# Patient Record
Sex: Female | Born: 1985
Health system: Southern US, Community
[De-identification: ages and names within clinical notes are randomized; demographics above are authoritative.]

## PROBLEM LIST (undated history)

## (undated) ENCOUNTER — Emergency Department (HOSPITAL_COMMUNITY): Discharge status: Absent without leave | Payer: 59

## (undated) DIAGNOSIS — F419 Anxiety disorder, unspecified: Secondary | ICD-10-CM

## (undated) DIAGNOSIS — E079 Disorder of thyroid, unspecified: Secondary | ICD-10-CM

## (undated) HISTORY — DX: Anxiety disorder, unspecified: F41.9

## (undated) HISTORY — DX: Disorder of thyroid, unspecified: E07.9

## (undated) HISTORY — PX: OTHER SURGICAL HISTORY: SHX169

---

## 2009-08-03 DIAGNOSIS — E0500 Thyrotoxicosis with diffuse goiter without thyrotoxic crisis or storm: Secondary | ICD-10-CM | POA: Insufficient documentation

## 2013-07-12 DIAGNOSIS — E890 Postprocedural hypothyroidism: Secondary | ICD-10-CM | POA: Insufficient documentation

## 2016-10-14 DIAGNOSIS — E890 Postprocedural hypothyroidism: Secondary | ICD-10-CM | POA: Diagnosis not present

## 2016-12-06 DIAGNOSIS — J1089 Influenza due to other identified influenza virus with other manifestations: Secondary | ICD-10-CM | POA: Diagnosis not present

## 2016-12-24 DIAGNOSIS — E890 Postprocedural hypothyroidism: Secondary | ICD-10-CM | POA: Diagnosis not present

## 2017-02-10 MED FILL — LEVOTHYROXINE 175 MCG TAB: 175 | 90 days supply | Qty: 102 | Fill #0

## 2017-02-26 ENCOUNTER — Ambulatory Visit (INDEPENDENT_AMBULATORY_CARE_PROVIDER_SITE_OTHER): Payer: 59 | Admitting: Nurse Practitioner

## 2017-02-26 ENCOUNTER — Encounter: Payer: Self-pay | Admitting: Nurse Practitioner

## 2017-02-26 VITALS — BP 114/76 | HR 84 | Resp 16 | Ht 67.25 in | Wt 212.0 lb

## 2017-02-26 DIAGNOSIS — Z01419 Encounter for gynecological examination (general) (routine) without abnormal findings: Secondary | ICD-10-CM | POA: Diagnosis not present

## 2017-02-26 DIAGNOSIS — Z124 Encounter for screening for malignant neoplasm of cervix: Secondary | ICD-10-CM | POA: Diagnosis not present

## 2017-02-26 DIAGNOSIS — Z0000 Encounter for general adult medical examination without abnormal findings: Secondary | ICD-10-CM | POA: Diagnosis not present

## 2017-02-26 DIAGNOSIS — Z113 Encounter for screening for infections with a predominantly sexual mode of transmission: Secondary | ICD-10-CM | POA: Diagnosis not present

## 2017-02-26 NOTE — Progress Notes (Signed)
31 y.o. G0P0A0. African American Fe here for NGYN annual exam. Menses normal every 3 months with Seasonique.  This last cycle 2.5 weeks long.  She has taken pill late or missed pill during that time. Also off Synthroid  2 weeks due to transfer of pharmacy choice. Working 7 pm - 7 am 3 days a week.  Normally PMS, cramps,  Flow moderate  And at 3-4 days.    Same partner of 2.5 months  History of hypothyroid X 10 yrs.and followed by Dr. Tod Persia at [facility].  Would like to discuss other birth control options  Patient's last menstrual period was 02/23/2017.          Sexually active: Yes.    The current method of family planning is OCP (estrogen/progesterone).    Exercising: Yes.    running and weight lifting Smoker:  no  Health Maintenance: Pap:  January 2017 per patient normal -- done @ [facility]:  About 3-4 years ago per patient Hep C and HIV: about 2017 per patient -- NEG Labs: discuss today   reports that she has never smoked. She has never used smokeless tobacco. She reports that she drinks about 1.8 oz of alcohol per week . She reports that she does not use drugs.  Past Medical History:  Diagnosis Date  . Thyroid disease     History reviewed. No pertinent surgical history.  Current Outpatient Prescriptions  Medication Sig Dispense Refill  . Levonorgestrel-Ethinyl Estradiol (AMETHIA,CAMRESE) 0.15-0.03 &0.01 MG tablet Take 1 tablet by mouth daily.    Marland Kitchen levothyroxine (SYNTHROID, LEVOTHROID) 175 MCG tablet Take one tablet daily Monday-Saturday and 2 tablets on Sunday  0   No current facility-administered medications for this visit.     Family History  Problem Relation Age of Onset  . Thyroid disease Mother   . Thyroid disease Sister   . Down syndrome Sister   . Diabetes Paternal Aunt   . Diabetes Paternal Uncle   . Lung cancer Maternal Grandfather   . Prostate cancer Paternal Grandfather     ROS:  Pertinent items are noted in HPI.  Otherwise, a  comprehensive ROS was negative.  Exam:   BP 114/76 (BP Location: Right Arm, Patient Position: Sitting, Cuff Size: Large)   Pulse 84   Resp 16   Ht 5' 7.25" (1.708 m)   Wt 212 lb (96.2 kg)   LMP 02/23/2017   BMI 32.96 kg/m  Height: 5' 7.25" (170.8 cm) Ht Readings from Last 3 Encounters:  02/26/17 5' 7.25" (1.708 m)    General appearance: alert, cooperative and appears stated age Head: Normocephalic, without obvious abnormality, atraumatic Neck: no adenopathy, supple, symmetrical, trachea midline and thyroid normal to inspection and palpation Lungs: clear to auscultation bilaterally Breasts: normal appearance, no masses or tenderness Heart: regular rate and rhythm Abdomen: soft, non-tender; no masses,  no organomegaly Extremities: extremities normal, atraumatic, no cyanosis or edema Skin: Skin color, texture, turgor normal. No rashes or lesions Lymph nodes: Cervical, supraclavicular, and axillary nodes normal. No abnormal inguinal nodes palpated Neurologic: Grossly normal   Pelvic: External genitalia:  no lesions              Urethra:  normal appearing urethra with no masses, tenderness or lesions              Bartholin's and Skene's: normal                 Vagina: normal appearing vagina with normal color and discharge, no  lesions              Cervix: anteverted              Pap taken: Yes.   Bimanual Exam:  Uterus:  normal size, contour, position, consistency, mobility, non-tender              Adnexa: no mass, fullness, tenderness               Rectovaginal: Confirms               Anus:  normal sphincter tone, no lesions  Chaperone present: yes  A:  Well Woman with normal exam  OCP for contraception - desires other [place] IUD  R/O STD's  History of normal pap  P:   Reviewed health and wellness pertinent to exam  Pap smear was done  Follow with labs  Information is given about Verdia Kuba IUD and insurance info  Counseled on breast self exam, STD  prevention, family planning choices, adequate intake of calcium and vitamin D, diet and exercise return annually or prn  An After Visit Summary was printed and given to the patient.

## 2017-02-26 NOTE — Patient Instructions (Signed)

## 2017-02-27 LAB — STD PANEL
HEP B S AG: NEGATIVE
HIV: NONREACTIVE

## 2017-02-27 LAB — GC/CHLAMYDIA PROBE AMP
CT PROBE, AMP APTIMA: NOT DETECTED
GC Probe RNA: NOT DETECTED

## 2017-02-27 LAB — HEPATITIS C ANTIBODY: HCV AB: NEGATIVE

## 2017-02-27 LAB — WET PREP BY MOLECULAR PROBE
CANDIDA SPECIES: DETECTED — AB
GARDNERELLA VAGINALIS: DETECTED — AB
TRICHOMONAS VAG: NOT DETECTED

## 2017-02-27 LAB — IPS PAP TEST WITH REFLEX TO HPV

## 2017-02-27 MED ORDER — METRONIDAZOLE 0.75 % VA GEL: 1.0000 | Freq: Two times a day (BID) | VAGINAL | 0 refills | Status: AC

## 2017-02-27 MED ORDER — FLUCONAZOLE 150 MG PO TABS: ORAL_TABLET | ORAL | 0 refills | Status: DC

## 2017-02-27 MED FILL — VANDAZOLE VAGINAL 0.75% GEL: 0.75 | 5 days supply | Qty: 70 | Fill #0

## 2017-02-27 MED FILL — FLUCONAZOLE 150 MG TABLET: 150 | 5 days supply | Qty: 2 | Fill #0

## 2017-03-01 NOTE — Progress Notes (Signed)
Reviewed personally.  M. Suzanne Adekunle Rohrbach, MD.  

## 2017-05-28 MED FILL — LEVOTHYROXINE 175 MCG TAB: 175 | 89 days supply | Qty: 102 | Fill #0

## 2017-06-09 ENCOUNTER — Telehealth: Payer: Self-pay | Admitting: Obstetrics & Gynecology

## 2017-06-09 ENCOUNTER — Other Ambulatory Visit: Payer: Self-pay | Admitting: Obstetrics & Gynecology

## 2017-06-09 MED ORDER — MISOPROSTOL 200 MCG PO TABS: ORAL_TABLET | ORAL | 0 refills | Status: DC

## 2017-06-09 NOTE — Telephone Encounter (Signed)
Spoke with pt.  She is not SA and can come on Thursday.  Please put pt on schedule at 3pm.  Rx for cytotec 246mcg pv pm and am before procedure sent to the pharmacy on file.  Pt advised to take motrin 800mg  1 hour before procedure as well.  Questions answered.

## 2017-06-09 NOTE — Telephone Encounter (Signed)
Patient's cycle started and she's calling to schedule iud insertion.

## 2017-06-09 NOTE — Telephone Encounter (Signed)
Spoke with patient. Patient reports LMP 7/4, stopped OCP  on 06/07/17 d/t running out of medication. Patient discussed Verdia Kuba IUD at last [facility] dated 02/26/17 with Kem Boroughs, NP and would like to schedule insertion. Reports has not been SA in over 1 month. Advised patient given she has stopped OCP and tomorrow is day 7 of cycle, no available appointments, will need to review with Dr. Sabra Heck to advise on scheduling IUD and return call. Patient verbalizes understanding and is agreeable.   Dr. Sabra Heck, please advise on scheduling Verdia Kuba IUD insertion?

## 2017-06-10 ENCOUNTER — Other Ambulatory Visit: Payer: Self-pay | Admitting: *Deleted

## 2017-06-10 DIAGNOSIS — Z30014 Encounter for initial prescription of intrauterine contraceptive device: Secondary | ICD-10-CM

## 2017-06-10 DIAGNOSIS — Z30430 Encounter for insertion of intrauterine contraceptive device: Secondary | ICD-10-CM

## 2017-06-10 MED FILL — miSOPROStol 200 MCG TABS: 200 | 2 days supply | Qty: 2 | Fill #0

## 2017-06-10 NOTE — Telephone Encounter (Signed)
Patient placed on schedule for 06/12/17 at 3pm.   Routing to provider for final review. Patient is agreeable to disposition. Will close encounter.

## 2017-06-12 ENCOUNTER — Encounter: Payer: Self-pay | Admitting: Obstetrics & Gynecology

## 2017-06-12 ENCOUNTER — Ambulatory Visit (INDEPENDENT_AMBULATORY_CARE_PROVIDER_SITE_OTHER): Payer: 59 | Admitting: Obstetrics & Gynecology

## 2017-06-12 VITALS — BP 114/80 | HR 70 | Resp 14 | Ht 67.25 in | Wt 211.3 lb

## 2017-06-12 DIAGNOSIS — Z30430 Encounter for insertion of intrauterine contraceptive device: Secondary | ICD-10-CM | POA: Diagnosis not present

## 2017-06-12 DIAGNOSIS — Z01812 Encounter for preprocedural laboratory examination: Secondary | ICD-10-CM | POA: Diagnosis not present

## 2017-06-12 LAB — POCT URINE PREGNANCY: Preg Test, Ur: NEGATIVE

## 2017-06-12 NOTE — Progress Notes (Signed)
31 y.o. G0P0000 Single African American female presents for insertion of Kyleena.  Pt has been counseled about alternative forms of contraception including OCPs, progesterone options, condoms, and natural family planning.  She feels IUD is the better option for her.  Pt has also been counseled about risks and benefits as well as complications.  Consent is obtained today.  All questions answered prior to start of procedure.    Current contraception: OCPs Last STD testing:  02/26/17 LMP:  Patient's last menstrual period was 06/09/2017.  There are no active problems to display for this patient.  Past Medical History:  Diagnosis Date  . Thyroid disease    Current Outpatient Prescriptions on File Prior to Visit  Medication Sig Dispense Refill  . levothyroxine (SYNTHROID, LEVOTHROID) 175 MCG tablet Take one tablet daily Monday-Saturday and 2 tablets on Sunday  0   No current facility-administered medications on file prior to visit.    Patient has no known allergies.  Review of Systems  All other systems reviewed and are negative.  Vitals:   06/12/17 1502  BP: 114/80  Pulse: 70  Resp: 14  Weight: 211 lb 4.8 oz (95.8 kg)  Height: 5' 7.25" (1.708 m)    Gen:  WNWF healthy female NAD Abdomen: soft, non-tender Groin:  no inguinal nodes palpated  Pelvic exam: Vulva:  normal female genitalia Vagina:  normal vagina Cervix:  Non-tender, Negative CMT, no lesions or redness. Uterus:  normal shape, position and consistency   Procedure:  Speculum reinserted.  Cervix visualized and cleansed with Betadine x 3.  Paracervical block was not placed.  Single toothed tenaculum applied to anterior lip of cervix without difficulty.  Uterus sounded to 6.5cm.  Lot number: [id number].  Expiration:  11/19.  IUD package was opened.  IUD and introducer passed to fundus and then withdrawn slightly before IUD was passed into endometrial cavity.  Introducer removed.  Strings cut to 2cm.  Tenaculum removed from  cervix.  Minimal bleeding noted.  Pt tolerated the procedure well.  All instruments removed from vagina.  A: Insertion of Kyleena IUD Contraception desires  P:  Return for recheck 6-8 weeks Pt aware to call for any concerns Pt aware removal due no later than 06/12/2022.  IUD card given to pt.

## 2017-07-15 DIAGNOSIS — E669 Obesity, unspecified: Secondary | ICD-10-CM | POA: Diagnosis not present

## 2017-07-15 DIAGNOSIS — E890 Postprocedural hypothyroidism: Secondary | ICD-10-CM | POA: Diagnosis not present

## 2017-07-24 ENCOUNTER — Encounter: Payer: Self-pay | Admitting: Obstetrics & Gynecology

## 2017-07-24 ENCOUNTER — Ambulatory Visit (INDEPENDENT_AMBULATORY_CARE_PROVIDER_SITE_OTHER): Payer: 59

## 2017-07-24 ENCOUNTER — Ambulatory Visit (INDEPENDENT_AMBULATORY_CARE_PROVIDER_SITE_OTHER): Payer: 59 | Admitting: Obstetrics & Gynecology

## 2017-07-24 VITALS — BP 110/80 | HR 78 | Resp 14 | Ht 68.0 in | Wt 214.0 lb

## 2017-07-24 DIAGNOSIS — N926 Irregular menstruation, unspecified: Secondary | ICD-10-CM

## 2017-07-24 DIAGNOSIS — R102 Pelvic and perineal pain: Secondary | ICD-10-CM | POA: Diagnosis not present

## 2017-07-24 DIAGNOSIS — D251 Intramural leiomyoma of uterus: Secondary | ICD-10-CM | POA: Diagnosis not present

## 2017-07-24 DIAGNOSIS — Z975 Presence of (intrauterine) contraceptive device: Secondary | ICD-10-CM

## 2017-07-24 NOTE — Addendum Note (Signed)
Addended by: Megan Salon on: 07/24/2017 10:01 PM   Modules accepted: Level of Service

## 2017-07-24 NOTE — Progress Notes (Addendum)
GYNECOLOGY  VISIT   HPI: 31 y.o. G0P0000 Single African American female here for recheck after IUD recheck.  Pt has Kyleena placed 06/12/17.  Cramped for about two days after placement. This resolved.  She's been spotting fairly regularly since placement.  Pt did try to have intercourse once last week and had pain.  Bleeding started after this.  Actual cycle was 06/28/17.  It lasted four days but was heavier than normal.  When she was on an oral contraceptive, she would bleed for four days as well but it was lighter.    Denies N/V/D/C.    GYNECOLOGIC HISTORY: Patient's last menstrual period was 07/02/2017. Contraception: Kyleena IUD  Patient Active Problem List   Diagnosis Date Noted  . Other postablative hypothyroidism 07/12/2013  . Toxic diffuse goiter 08/03/2009    Past Medical History:  Diagnosis Date  . Thyroid disease     History reviewed. No pertinent surgical history.  MEDS:   Current Outpatient Prescriptions on File Prior to Visit  Medication Sig Dispense Refill  . levothyroxine (SYNTHROID, LEVOTHROID) 175 MCG tablet Take one tablet daily Monday-Saturday and 2 tablets on Sunday  0   No current facility-administered medications on file prior to visit.     ALLERGIES: Patient has no known allergies.  Family History  Problem Relation Age of Onset  . Thyroid disease Mother   . Thyroid disease Sister   . Down syndrome Sister   . Diabetes Paternal Aunt   . Diabetes Paternal Uncle   . Lung cancer Maternal Grandfather   . Prostate cancer Paternal Grandfather     SH:  Married,   Review of Systems  Constitutional: Negative.   Respiratory: Negative.   Cardiovascular: Negative.   Gastrointestinal: Negative.   Genitourinary: Negative.   All other systems reviewed and are negative.   PHYSICAL EXAMINATION:    BP 110/80 (BP Location: Right Arm, Patient Position: Sitting, Cuff Size: Normal)   Pulse 78   Resp 14   Ht 5\' 8"  (1.727 m)   Wt 214 lb (97.1 kg)   LMP  07/02/2017   BMI 32.54 kg/m     General appearance: alert, cooperative and appears stated age Abdomen: soft, non-tender; bowel sounds normal; no masses,  no organomegaly  Pelvic: External genitalia:  no lesions              Urethra:  normal appearing urethra with no masses, tenderness or lesions              Bartholins and Skenes: normal                 Vagina: normal appearing vagina with normal color and discharge, no lesions              Cervix: no lesions and IUD string originally not seen but with cytobrush was teased out of the cervix and is less than 1cm              Bimanual Exam:  Uterus:  normal size, contour, position, consistency, mobility, non-tender              Adnexa: no mass, fullness, tenderness              Anus:  no lesions  Chaperone was present for exam.  Assessment: Recheck after IUD placement 06/12/17 Pelvic pain DUB  Plan: PUS today to check placement  This could be performed in office today.  Results: Uterus:  6.7 x 5.7 x 4.5cm with 2.5 x 1.8cm fibroid  that is either just adjacent to the endometrium or has a small submucosal component Endometrium:  6.14mm with IUD in correct location Left ovary:  2.8 x 1.5 x 1.8cm Right ovary:  3.1 x 1.6 x 1.8cm with 1.5cm collapsed corpus luteal cyst Cul de sac:  No free fluid  Findings reviewed with pt.  Do not think location of fibroid is going to be an issue.  However, will plan to follow pt conservatively.  Recheck 2-3 months.  Pt in agreement with plan.  ~30 minutes spent with patient >50% of time was in face to face discussion.

## 2017-08-26 DIAGNOSIS — J029 Acute pharyngitis, unspecified: Secondary | ICD-10-CM | POA: Diagnosis not present

## 2017-08-26 DIAGNOSIS — E0590 Thyrotoxicosis, unspecified without thyrotoxic crisis or storm: Secondary | ICD-10-CM | POA: Insufficient documentation

## 2017-08-26 DIAGNOSIS — J349 Unspecified disorder of nose and nasal sinuses: Secondary | ICD-10-CM | POA: Diagnosis not present

## 2017-09-23 MED FILL — LEVOTHYROXINE 175 MCG TAB: 175 | 90 days supply | Qty: 102 | Fill #0

## 2017-09-25 ENCOUNTER — Ambulatory Visit (INDEPENDENT_AMBULATORY_CARE_PROVIDER_SITE_OTHER): Payer: 59 | Admitting: Obstetrics & Gynecology

## 2017-09-25 VITALS — BP 110/76 | HR 88 | Resp 16 | Ht 68.0 in | Wt 214.0 lb

## 2017-09-25 DIAGNOSIS — D251 Intramural leiomyoma of uterus: Secondary | ICD-10-CM

## 2017-09-25 NOTE — Progress Notes (Signed)
GYNECOLOGY  VISIT  CC:   IUD recheck   HPI: 31 y.o. G0P0000 Single African American female here for IUD placement check.  Pt reports she spotted for about 2 1/2 weeks after placement.  This stopped and she had a normal cycle in September.  LMP started with spotting on 10/18.  She spotted for two days and then cycle was Saturday until Tuesday.  Having very light spotting.  Volume of bleeding has improved significantly.    Has tried to feel her strings.    Denies pain with intercourse.    Thus far, pleased with improvement in bleeding related to IUD.  Aware this will likely continue for several more months.  GYNECOLOGIC HISTORY: Patient's last menstrual period was 09/21/2017. Contraception:  IUD - Kyleena placed 06/12/17 Menopausal hormone therapy: none  Patient Active Problem List   Diagnosis Date Noted  . IUD (intrauterine device) in place 07/24/2017  . Fibroids, intramural 07/24/2017  . Obesity (BMI 30.0-34.9) 07/15/2017  . Other postablative hypothyroidism 07/12/2013  . Toxic diffuse goiter 08/03/2009    Past Medical History:  Diagnosis Date  . Thyroid disease     No past surgical history on file.  MEDS:   Current Outpatient Prescriptions on File Prior to Visit  Medication Sig Dispense Refill  . Levonorgestrel (KYLEENA IU) by Intrauterine route. Placed 06/12/17    . levothyroxine (SYNTHROID, LEVOTHROID) 175 MCG tablet Take one tablet daily Monday-Saturday and 2 tablets on Sunday  0   No current facility-administered medications on file prior to visit.     ALLERGIES: Patient has no known allergies.  Family History  Problem Relation Age of Onset  . Thyroid disease Mother   . Thyroid disease Sister   . Down syndrome Sister   . Diabetes Paternal Aunt   . Diabetes Paternal Uncle   . Lung cancer Maternal Grandfather   . Prostate cancer Paternal Grandfather     SH:  Single, non smoker  Review of Systems  All other systems reviewed and are negative.   PHYSICAL  EXAMINATION:    BP 110/76 (BP Location: Right Arm, Patient Position: Sitting, Cuff Size: Large)   Pulse 88   Resp 16   Ht 5\' 8"  (1.727 m)   Wt 214 lb (97.1 kg)   LMP 09/21/2017   BMI 32.54 kg/m     General appearance: alert, cooperative and appears stated age Abdomen: soft, non-tender; bowel sounds normal; no masses,  no organomegaly  Pelvic: External genitalia:  no lesions              Urethra:  normal appearing urethra with no masses, tenderness or lesions              Bartholins and Skenes: normal                 Vagina: normal appearing vagina with normal color and discharge, no lesions              Cervix: no lesions, IUD string seen              Bimanual Exam:  Uterus:  normal size, contour, position, consistency, mobility, non-tender              Adnexa: no mass, fullness, tenderness              Anus:  no lesions  Chaperone was present for exam.  Assessment: Kyleena placement 06/12/17 H/o 2.5 x 1.8c fibroid adjacent to endometrium  Plan: Pt is pleased with improvement in  bleeding with IUD.  Knows to call with any concerns but will plan to recheck at [facility] unless pt has new concern/symptoms.   ~15 minutes spent with patient >50% of time was in face to face discussion of above.

## 2017-09-28 ENCOUNTER — Encounter: Payer: Self-pay | Admitting: Obstetrics & Gynecology

## 2017-12-22 ENCOUNTER — Telehealth: Payer: Self-pay | Admitting: Obstetrics & Gynecology

## 2017-12-22 DIAGNOSIS — Z30431 Encounter for routine checking of intrauterine contraceptive device: Secondary | ICD-10-CM

## 2017-12-22 NOTE — Telephone Encounter (Signed)
Spoke with patient. Susan Johnson placed 06/2017, concerned "can barely feel Johnson strings". Patient states strings initially too long, trimmed at 09/2017 OV, now may be too short, concerned Johnson has moved.   Denies pain, irregular bleeding , vaginal d/c, fever/chills, N/V.   Reports cycles are now just spotting, LMP "last week".   Recommended PUS for further evaluation. PUS scheduled for 1/24 at 3:30pm with consult at 4pm. Advised patient Dr. Sabra Heck will review, I will return call with any additional recommendations. Patient verbalizes understanding and is agreeable.   Dr. Sabra Heck, any additional recommendations?

## 2017-12-22 NOTE — Telephone Encounter (Signed)
Patient states she had IUD insertion back in July.  Patient states she can hardly touch her string.  She is concerned that it may be moving and want to know if this is normal.  Patient states she is not having any pain.

## 2017-12-22 NOTE — Telephone Encounter (Signed)
Order placed for PUS. Will close encounter.

## 2017-12-22 NOTE — Telephone Encounter (Signed)
Agree with recommendations. Thanks!

## 2017-12-22 NOTE — Telephone Encounter (Signed)
Agree with recommendations.  Ok to close encounter. 

## 2017-12-25 ENCOUNTER — Ambulatory Visit: Payer: 59 | Admitting: Obstetrics & Gynecology

## 2017-12-25 ENCOUNTER — Encounter: Payer: Self-pay | Admitting: Obstetrics & Gynecology

## 2017-12-25 ENCOUNTER — Ambulatory Visit (INDEPENDENT_AMBULATORY_CARE_PROVIDER_SITE_OTHER): Payer: 59

## 2017-12-25 VITALS — BP 102/60 | HR 68 | Resp 16 | Wt 213.0 lb

## 2017-12-25 DIAGNOSIS — N930 Postcoital and contact bleeding: Secondary | ICD-10-CM

## 2017-12-25 DIAGNOSIS — Z30431 Encounter for routine checking of intrauterine contraceptive device: Secondary | ICD-10-CM | POA: Diagnosis not present

## 2017-12-25 MED ORDER — NORETHINDRONE 0.35 MG PO TABS: 1.0000 | ORAL_TABLET | Freq: Every day | ORAL | 2 refills | Status: DC

## 2017-12-25 MED FILL — NORETHINDRONE 0.35 MG TAB: 0.35 | 84 days supply | Qty: 84 | Fill #0

## 2017-12-25 NOTE — Progress Notes (Signed)
32 y.o. G0P0000 Single African American female here for repeat pelvic ultrasound due to assess fibroid size and growth.  Has IUD and is not cycling with this.  Does have some postcoital bleeding that is bothersome.  Last pap 02/26/17 and was negative.  Does not think she has STD risks but would like Gc/Chl testing today.  No LMP recorded. Patient is not currently having periods (Reason: IUD).  Contraception: IUD  Findings:  UTERUS: 7.8 x 5.2 x 4.0cm with 2.5 x 1.2cm intramural fibroid EMS:4.38mm ADNEXA: Left ovary: 2.9 x 2.1 x 1.4cm       Right ovary: 3.7 x 1.8 x 2.2cm with 1.9 x 2.1FX follicle CUL DE SAC:  No free fluid  Discussion:  Findings reviewed.  IUD in correct location within endometrium.  Intramural fibroid is stable in size.  Last pap in 2018 was negative.  Will repeat Gc/Chl testing today just to be sure.  Assessment:  Post coital bleeding with intramural fibroid sand Kyleena IUD in correct placement  Plan:  Will try three months of Micronor to see if this helps.  Rx to pharmacy.  Pt will call and give update in two to three months or if symptoms worsen. GC/Chl pending.  ~15 minutes spent with patient >50% of time was in face to face discussion of above.

## 2017-12-26 LAB — GC/CHLAMYDIA PROBE AMP
Chlamydia trachomatis, NAA: NEGATIVE
NEISSERIA GONORRHOEAE BY PCR: NEGATIVE

## 2018-01-12 MED FILL — LEVOTHYROXINE 175 MCG TAB: 175 | 90 days supply | Qty: 102 | Fill #0

## 2018-02-27 DIAGNOSIS — M19071 Primary osteoarthritis, right ankle and foot: Secondary | ICD-10-CM | POA: Diagnosis not present

## 2018-02-27 DIAGNOSIS — M76821 Posterior tibial tendinitis, right leg: Secondary | ICD-10-CM | POA: Diagnosis not present

## 2018-02-27 DIAGNOSIS — M19072 Primary osteoarthritis, left ankle and foot: Secondary | ICD-10-CM | POA: Diagnosis not present

## 2018-02-27 DIAGNOSIS — M71572 Other bursitis, not elsewhere classified, left ankle and foot: Secondary | ICD-10-CM | POA: Diagnosis not present

## 2018-02-27 DIAGNOSIS — M71571 Other bursitis, not elsewhere classified, right ankle and foot: Secondary | ICD-10-CM | POA: Diagnosis not present

## 2018-02-27 DIAGNOSIS — M76822 Posterior tibial tendinitis, left leg: Secondary | ICD-10-CM | POA: Diagnosis not present

## 2018-03-06 DIAGNOSIS — M71571 Other bursitis, not elsewhere classified, right ankle and foot: Secondary | ICD-10-CM | POA: Diagnosis not present

## 2018-03-06 DIAGNOSIS — M71572 Other bursitis, not elsewhere classified, left ankle and foot: Secondary | ICD-10-CM | POA: Diagnosis not present

## 2018-03-06 DIAGNOSIS — M76821 Posterior tibial tendinitis, right leg: Secondary | ICD-10-CM | POA: Diagnosis not present

## 2018-05-13 MED FILL — LEVOTHYROXINE 175 MCG TAB: 175 | 90 days supply | Qty: 102 | Fill #1

## 2018-05-29 ENCOUNTER — Ambulatory Visit (INDEPENDENT_AMBULATORY_CARE_PROVIDER_SITE_OTHER): Payer: Self-pay | Admitting: Family Medicine

## 2018-05-29 VITALS — BP 110/76 | HR 88 | Temp 97.9°F | Resp 18 | Ht 68.0 in | Wt 219.0 lb

## 2018-05-29 DIAGNOSIS — Z0000 Encounter for general adult medical examination without abnormal findings: Secondary | ICD-10-CM

## 2018-05-29 DIAGNOSIS — J309 Allergic rhinitis, unspecified: Secondary | ICD-10-CM

## 2018-05-29 MED ORDER — AZELASTINE HCL 0.1 % NA SOLN: 2.0000 | Freq: Two times a day (BID) | NASAL | 0 refills | Status: DC

## 2018-05-29 MED FILL — AZELASTINE HCL 137 MCG/SPRA: 137 | 30 days supply | Qty: 30 | Fill #0

## 2018-05-29 NOTE — Patient Instructions (Signed)

## 2018-05-29 NOTE — Progress Notes (Signed)
Susan Johnson is a 31 y.o. female who presents today with concerns of need for an annual physical. She is under the care of GYN and endocrine but does not have a PCP. She does have chronic health conditions that are being effectively managed per patient report.  Review of Systems  Constitutional: Negative for chills, fever and malaise/fatigue.  HENT: Negative for congestion, ear discharge, ear pain, sinus pain and sore throat.   Eyes: Negative.   Respiratory: Negative for cough, sputum production and shortness of breath.   Cardiovascular: Negative.  Negative for chest pain.  Gastrointestinal: Negative for abdominal pain, diarrhea, nausea and vomiting.  Genitourinary: Negative for dysuria, frequency, hematuria and urgency.  Musculoskeletal: Negative for myalgias.  Skin: Negative.   Neurological: Negative for headaches.  Endo/Heme/Allergies: Negative.   Psychiatric/Behavioral: Negative.     O: Vitals:   05/29/18 0910  BP: 110/76  Pulse: 88  Resp: 18  Temp: 97.9 F (36.6 C)  SpO2: 97%     Physical Exam  Constitutional: She is oriented to person, place, and time. Vital signs are normal. She appears well-developed and well-nourished. She is active.  Non-toxic appearance. She does not have a sickly appearance.  HENT:  Head: Normocephalic.  Right Ear: Hearing, tympanic membrane, external ear and ear canal normal.  Left Ear: Hearing, tympanic membrane, external ear and ear canal normal.  Nose: Mucosal edema and rhinorrhea present.  Mouth/Throat: Uvula is midline and oropharynx is clear and moist. Tonsils are 2+ on the right. Tonsils are 2+ on the left. No tonsillar exudate.  Neck: Normal range of motion. Neck supple.  Cardiovascular: Normal rate, regular rhythm, normal heart sounds and normal pulses.  Pulmonary/Chest: Effort normal and breath sounds normal.  Abdominal: Soft. Bowel sounds are normal.  Musculoskeletal: Normal range of motion.  Lymphadenopathy:       Head (right  side): No submental and no submandibular adenopathy present.       Head (left side): No submental and no submandibular adenopathy present.    She has no cervical adenopathy.  Neurological: She is alert and oriented to person, place, and time.  Psychiatric: She has a normal mood and affect.  Vitals reviewed.  A: 1. Physical exam    P: Exam findings, diagnosis etiology and medication use and indications reviewed with patient. Follow- Up and discharge instructions provided. No emergent/urgent issues found on exam.  Patient verbalized understanding of information provided and agrees with plan of care (POC), all questions answered.  1. Physical exam WNL

## 2018-08-05 DIAGNOSIS — E890 Postprocedural hypothyroidism: Secondary | ICD-10-CM | POA: Diagnosis not present

## 2018-09-11 MED FILL — LEVOTHYROXINE 175 MCG TAB: 175 | 84 days supply | Qty: 96 | Fill #0

## 2019-01-04 MED FILL — LEVOTHYROXINE 175 MCG TAB: 175 | 84 days supply | Qty: 96 | Fill #0

## 2019-04-09 MED FILL — LEVOTHYROXINE 175 MCG TAB: 175 | 84 days supply | Qty: 96 | Fill #1

## 2019-07-23 MED FILL — LEVOTHYROXINE 175 MCG TAB: 175 | 84 days supply | Qty: 96 | Fill #2

## 2019-10-15 MED FILL — AZITHROMYCIN 500 MG TABLET: 500 | 5 days supply | Qty: 5 | Fill #0

## 2019-10-15 MED FILL — FLUCONAZOLE 150 MG TABS: 150 | 3 days supply | Qty: 2 | Fill #0

## 2019-11-03 MED FILL — LEVOTHYROXINE 175 MCG TAB: 175 | 84 days supply | Qty: 96 | Fill #0

## 2020-02-01 ENCOUNTER — Ambulatory Visit: Payer: Self-pay

## 2020-11-11 DIAGNOSIS — R059 Cough, unspecified: Secondary | ICD-10-CM | POA: Insufficient documentation

## 2020-11-21 ENCOUNTER — Encounter (HOSPITAL_COMMUNITY): Payer: Self-pay | Admitting: Emergency Medicine

## 2020-11-21 ENCOUNTER — Emergency Department (HOSPITAL_COMMUNITY): Payer: 59

## 2020-11-21 ENCOUNTER — Emergency Department (HOSPITAL_COMMUNITY)
Admission: EM | Admit: 2020-11-21 | Discharge: 2020-11-22 | Disposition: A | Discharge status: Home / Self Care | Payer: 59 | Attending: Emergency Medicine | Admitting: Emergency Medicine

## 2020-11-21 DIAGNOSIS — S99912A Unspecified injury of left ankle, initial encounter: Secondary | ICD-10-CM | POA: Diagnosis present

## 2020-11-21 DIAGNOSIS — X501XXA Overexertion from prolonged static or awkward postures, initial encounter: Secondary | ICD-10-CM | POA: Diagnosis not present

## 2020-11-21 DIAGNOSIS — S93402A Sprain of unspecified ligament of left ankle, initial encounter: Secondary | ICD-10-CM | POA: Insufficient documentation

## 2020-11-21 DIAGNOSIS — Z23 Encounter for immunization: Secondary | ICD-10-CM | POA: Insufficient documentation

## 2020-11-21 NOTE — ED Triage Notes (Signed)
Pt reports L ankle pain. States that she tripped over her dog at 620p today. No head injury or LOC.

## 2020-11-21 NOTE — ED Provider Notes (Addendum)
[facility] DEPT Provider Note   CSN: [id number] Arrival date & time: 11/21/20  2320     History Chief Complaint  Patient presents with  . Ankle Pain    Susan Johnson is a 34 y.o. female.  Patient presents to the emergency department with a chief complaint of left ankle pain.  She states that she tripped over her dog at around 6:20 PM tonight.  She rolled her ankle and felt a pop.  She reports increased pain with movement and palpation.  She has been ambulatory, but walks with a limp.  She denies treatment prior to arrival.  Denies any other injuries.  The history is provided by the patient. No language interpreter was used.       Past Medical History:  Diagnosis Date  . Thyroid disease     Patient Active Problem List   Diagnosis Date Noted  . IUD (intrauterine device) in place 07/24/2017  . Fibroids, intramural 07/24/2017  . Obesity (BMI 30.0-34.9) 07/15/2017  . Other postablative hypothyroidism 07/12/2013  . Toxic diffuse goiter 08/03/2009    History reviewed. No pertinent surgical history.   OB History    Gravida  0   Para  0   Term  0   Preterm  0   AB  0   Living  0     SAB  0   IAB  0   Ectopic  0   Multiple  0   Live Births  0           Family History  Problem Relation Age of Onset  . Thyroid disease Mother   . Thyroid disease Sister   . Down syndrome Sister   . Diabetes Paternal Aunt   . Diabetes Paternal Uncle   . Lung cancer Maternal Grandfather   . Prostate cancer Paternal Grandfather     Social History   Tobacco Use  . Smoking status: Never Smoker  . Smokeless tobacco: Never Used  Vaping Use  . Vaping Use: Never used  Substance Use Topics  . Alcohol use: Yes    Alcohol/week: 3.0 standard drinks    Types: 3 Glasses of wine per week    Comment: occasional  . Drug use: No    Home Medications Prior to Admission medications   Medication Sig Start Date End Date Taking? Authorizing  Provider  azelastine (ASTELIN) 0.1 % nasal spray Place 2 sprays into both nostrils 2 (two) times daily. Use in each nostril as directed 05/29/18   Shella Maxim, NP  Levonorgestrel (KYLEENA IU) by Intrauterine route. Placed 06/12/17    [provider]  levothyroxine Wilmer Floor, LEVOTHROID) 175 MCG tablet Take one tablet daily Monday-Saturday and 2 tablets on Sunday 02/10/17   [provider]  norethindrone (MICRONOR,CAMILA,ERRIN) 0.35 MG tablet Take 1 tablet (0.35 mg total) by mouth daily. 12/25/17   Megan Salon, MD    Allergies    Patient has no known allergies.  Review of Systems   Review of Systems  All other systems reviewed and are negative.   Physical Exam Updated Vital Signs BP 128/84 (BP Location: Right Arm)   Pulse 84   Temp 98.6 F (37 C) (Oral)   Resp 16   Ht 5\' 8"  (1.727 m)   Wt 90.7 kg   SpO2 100%   BMI 30.41 kg/m   Physical Exam Nursing note and vitals reviewed.  Constitutional: Pt appears well-developed and well-nourished. No distress.  HENT:  Head: Normocephalic and atraumatic.  Eyes: Conjunctivae are normal.  Neck: Normal range of motion.  Cardiovascular: Normal rate, regular rhythm. Intact distal pulses.   Capillary refill < 3 sec.  Pulmonary/Chest: Effort normal and breath sounds normal.  Musculoskeletal:  ,Left ankle Pt exhibits TTP over the ATFL and medially near the arch.  Moderate associated swelling. ROM: limited by pain  Strength: limited by pain  Neurological: Pt  is alert. Coordination normal.  Sensation: 5/5 Skin: Skin is warm and dry. Pt is not diaphoretic.  No evidence of open wound or skin tenting Psychiatric: Pt has a normal mood and affect.   ED Results / Procedures / Treatments   Labs (all labs ordered are listed, but only abnormal results are displayed) Labs Reviewed - No data to display  EKG None  Radiology DG Ankle Complete Left  Result Date: 11/21/2020 CLINICAL DATA:  Left ankle pain.  Felt a pop.  EXAM: LEFT ANKLE COMPLETE - 3+ VIEW COMPARISON:  None. FINDINGS: No evidence of fracture, dislocation, or joint effusion. No widening of the medial or lateral clear spaces on Mortise view. no evidence of severe arthropathy. Sclerotic cortical lesion along the medial aspect of the distal tibia likely represents a sclerosed NOF. No aggressive appearing focal bone abnormality. Soft tissues are unremarkable. IMPRESSION: No acute displaced fracture or dislocation. Electronically Signed   By: Iven Finn M.D.   On: 11/21/2020 23:54    Procedures Procedures (including critical care time)  Medications Ordered in ED Medications - No data to display  ED Course  I have reviewed the triage vital signs and the nursing notes.  Pertinent labs & imaging results that were available during my care of the patient were reviewed by me and considered in my medical decision making (see chart for details).    MDM Rules/Calculators/A&P                          Patient presents with injury to left ankle.  DDx includes, fracture, strain, or sprain.  Consultants: none  Plain films reveal no fracture or dislocation.  Pt advised to follow up with PCP and/or orthopedics. Patient given crutches, has ASO, while in ED, conservative therapy such as RICE recommended and discussed.   Patient will be discharged home & is agreeable with above plan. Returns precautions discussed. Pt appears safe for discharge.  Requests update on Tdap.  Will give as professional courtesy.  Final Clinical Impression(s) / ED Diagnoses Final diagnoses:  Sprain of left ankle, unspecified ligament, initial encounter    Rx / DC Orders ED Discharge Orders         Ordered    ibuprofen (ADVIL) 800 MG tablet  3 times daily        11/22/20 0000           Montine Circle, PA-C 11/22/20 0001    Montine Circle, PA-C 11/22/20 0005    Mesner, Corene Cornea, MD 11/22/20 [phone]

## 2020-11-22 MED ORDER — IBUPROFEN 800 MG PO TABS: 800.0000 mg | ORAL_TABLET | Freq: Three times a day (TID) | ORAL | 0 refills | Status: DC

## 2020-11-22 MED ORDER — TETANUS-DIPHTH-ACELL PERTUSSIS 5-2.5-18.5 LF-MCG/0.5 IM SUSY
0.5000 mL | PREFILLED_SYRINGE | Freq: Once | INTRAMUSCULAR | Status: AC
  Administered 2020-11-22: 0.5 mL via INTRAMUSCULAR
  Filled 2020-11-22: qty 0.5

## 2020-12-26 NOTE — Progress Notes (Signed)
GYNECOLOGY  VISIT  CC:   Wants IUD checked  HPI: 35 y.o. [place] female here for problem with iud.   Kyleena IUD placed 06/12/2017 Unable to feel strings. Period is coming every 2 weeks, sometimes light and sometimes very heavy bleeding x 3 days. And then about 2 weeks later has light bleeding x 5 days.  This pattern of bleeding has been going on x 3 months.  Has not been sexually active in the last 2 months, has gotten out of a long term relationship. 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 Contraception: kyleena inserted 06-12-17 Menopausal hormone therapy: none  Patient Active Problem List   Diagnosis Date Noted  . IUD (intrauterine device) in place 07/24/2017  . Fibroids, intramural 07/24/2017  . Obesity (BMI 30.0-34.9) 07/15/2017  . Other postablative hypothyroidism 07/12/2013  . Toxic diffuse goiter 08/03/2009    Past Medical History:  Diagnosis Date  . Thyroid disease     Past Surgical History:  Procedure Laterality Date  . Verdia Kuba iud     inserted 06-12-17    MEDS:   Current Outpatient Medications on File Prior to Visit  Medication Sig Dispense Refill  . Levonorgestrel (KYLEENA IU) by Intrauterine route. Placed 06/12/17    . levothyroxine (SYNTHROID) 200 MCG tablet Take 1 tablet by mouth daily.    Marland Kitchen ibuprofen (ADVIL) 800 MG tablet Take 1 tablet (800 mg total) by mouth 3 (three) times daily. 21 tablet 0   No current facility-administered medications on file prior to visit.    ALLERGIES: Patient has no known allergies.  Family History  Problem Relation Age of Onset  . Thyroid disease Mother   . Thyroid disease Sister   . Down syndrome Sister   . Diabetes Paternal Aunt   . Diabetes  Paternal Uncle   . Lung cancer Maternal Grandfather   . Prostate cancer Paternal Grandfather      Review of Systems  Constitutional: Negative.   HENT: Negative.   Eyes: Negative.   Respiratory: Negative.   Cardiovascular: Negative.   Gastrointestinal: Negative.   Endocrine: Negative.   Genitourinary:       Unable to feel iud string, irregular spotting  Musculoskeletal: Negative.   Skin: Negative.   Allergic/Immunologic: Negative.   Neurological: Negative.   Hematological: Negative.   Psychiatric/Behavioral: Negative.     PHYSICAL EXAMINATION:    BP 110/70   Pulse 68   Resp 16   Wt 232 lb (105.2 kg)   LMP 12/11/2020 (Exact Date)   BMI 35.28 kg/m     General appearance: alert, cooperative, no acute distress Lymph:  no inguinal LAD noted  Pelvic: External genitalia:  no lesions              Urethra:  normal appearing urethra with no masses, tenderness or lesions              Bartholins and Skenes: normal                 Vagina: normal appearing vagina               Cervix: no lesions and appears mildly red, non friable, strings visible 2-3 cm in length  Bimanual Exam:  Uterus:  normal size, contour, position, consistency, mobility, non-tender              Adnexa: no mass, fullness, tenderness               Chaperone, Joy, CMA, was present for exam.  Assessment: Surveillance of previously prescribed intrauterine contraceptive device  Irregular intermenstrual bleeding - Plan: Levonorgestrel-Ethinyl Estradiol (SEASONIQUE) 0.15-0.03 &0.01 MG tablet, WET PREP FOR TRICH, YEAST, CLUE, C. trachomatis/N. gonorrhoeae RNA  Reassured kyleena string visible and appear normal  F/u 3 months, will discuss bleeding, continued use of Kyleena, complete annual physical with pap

## 2020-12-28 ENCOUNTER — Encounter: Payer: Self-pay | Admitting: Nurse Practitioner

## 2020-12-28 ENCOUNTER — Ambulatory Visit (INDEPENDENT_AMBULATORY_CARE_PROVIDER_SITE_OTHER): Payer: 59 | Admitting: Nurse Practitioner

## 2020-12-28 ENCOUNTER — Other Ambulatory Visit: Payer: Self-pay

## 2020-12-28 VITALS — BP 110/70 | HR 68 | Resp 16 | Wt 232.0 lb

## 2020-12-28 DIAGNOSIS — Z30431 Encounter for routine checking of intrauterine contraceptive device: Secondary | ICD-10-CM | POA: Diagnosis not present

## 2020-12-28 DIAGNOSIS — N921 Excessive and frequent menstruation with irregular cycle: Secondary | ICD-10-CM

## 2020-12-28 LAB — WET PREP FOR TRICH, YEAST, CLUE

## 2020-12-28 MED ORDER — LEVONORGEST-ETH ESTRAD 91-DAY 0.15-0.03 &0.01 MG PO TABS: 1.0000 | ORAL_TABLET | Freq: Every day | ORAL | 0 refills | Status: DC

## 2020-12-28 NOTE — Patient Instructions (Addendum)
It is hard to know why bleeding became irregular, maybe stress, maybe fibroid, it is hard to know at this time. We will rule out infection (results to come to My Chart). The hormones of the pill should regulate your bleeding and hopefully stop any bleeding for the next 3 months.  It is time for your annual physical exam, so when you know your schedule, call and schedule your appointment in a few months and we will discuss your bleeding, continued use of Kyleena, collect pap smear along with any other concerns.

## 2020-12-29 LAB — C. TRACHOMATIS/N. GONORRHOEAE RNA
C. trachomatis RNA, TMA: NOT DETECTED
N. gonorrhoeae RNA, TMA: NOT DETECTED

## 2021-01-01 NOTE — Progress Notes (Signed)
35 y.o. [place] or [place] female here for annual exam.     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 Has never had screening labs, does not have a PCP.  Concerned about back pain. Feels like it is related to breast size. She has permanent indention in shoulders from bra.  Patient's last menstrual period was 12/11/2020 (exact date).          Sexually active: Yes.    The current method of family planning is kyleena iud inserted 06-12-17 & ocp  Exercising: No.   Smoker:  no  Health Maintenance: Pap:  02-26-17 neg History of abnormal Pap:  no MMG:  none Colonoscopy:  none BMD:   none TDaP:  2021 Gardasil:   Not done Covid-19: pfizer Hep C testing: neg 2018 Screening Labs: Has never been done   reports that she has never smoked. She has never used smokeless tobacco. She reports current alcohol use of about 3.0 standard drinks of alcohol per week. She reports that she does not use drugs.  Past Medical History:  Diagnosis Date  . Thyroid disease     Past Surgical History:  Procedure Laterality Date  . Verdia Kuba iud     inserted 06-12-17    Current Outpatient Medications  Medication Sig Dispense Refill  . Levonorgestrel (KYLEENA IU) by Intrauterine route. Placed 06/12/17    . Levonorgestrel-Ethinyl Estradiol (SEASONIQUE) 0.15-0.03 &0.01 MG tablet Take 1 tablet by mouth daily. 91 tablet 0  . levothyroxine (SYNTHROID) 200 MCG tablet Take 1 tablet by mouth daily.     No current facility-administered medications for this visit.    Family History  Problem Relation Age of Onset  . Thyroid disease Mother   . Thyroid disease Sister   . Down syndrome Sister   . Diabetes Paternal Aunt   . Diabetes Paternal Uncle   . Lung cancer Maternal Grandfather   . Prostate cancer Paternal Grandfather      Review of Systems  Constitutional: Negative.   HENT: Negative.   Eyes: Negative.   Respiratory: Negative.   Cardiovascular: Negative.   Gastrointestinal: Negative.   Endocrine: Negative.   Genitourinary: Negative.   Musculoskeletal: Positive for back pain.       Back pain r/t breast size  Skin: Negative.   Allergic/Immunologic: Negative.   Neurological: Negative for tremors.  Hematological: Negative.   Psychiatric/Behavioral: Negative.     Exam:   BP 116/70   Pulse 70   Resp 16   Ht 5' 7.5" (1.715 m)   Wt 225 lb (102.1 kg)   LMP 12/11/2020 (Exact Date)   BMI 34.72 kg/m   Height: 5' 7.5" (171.5 cm)  General appearance: alert, cooperative and appears stated age, no acute distress Head: Normocephalic, without obvious abnormality Neck: no adenopathy, thyroid normal to inspection and palpation and followed by endocrinology Lungs: inspiratory wheezes upper lobes, improves with cough Breasts: Dense fibrocystic tissue bilaterally, one area more cystic than the rest: left breast 5-6 o'clock, 1cm from areolar border, smooth, round, depressible ~1cm.  Heart: regular rate and rhythm Abdomen: soft, non-tender; no masses,  no organomegaly Extremities: extremities normal, no edema Skin: No rashes or lesions Lymph nodes: Cervical, supraclavicular, and axillary nodes normal. No abnormal inguinal nodes palpated Neurologic: Grossly normal   Pelvic: External genitalia:  no  lesions              Urethra:  normal appearing urethra with no masses, tenderness or lesions              Bartholins and Skenes: normal                 Vagina: normal appearing vagina, appropriate for age, normal appearing discharge, no lesions              Cervix: neg cervical motion tenderness, no visible lesions, strings visible             Bimanual Exam:   Uterus:  normal size, contour, position, consistency, mobility, non-tender              Adnexa: no mass, fullness, tenderness                 Joy, CMA  Chaperone was present for exam.  Assessment:  Well woman exam with routine gynecological exam - Plan: CBC, Comp Met (CMET), Lipid panel, Cytology - PAP( [facility])  Screening for human immunodeficiency virus - Plan: HIV Antibody (routine testing w rflx)  Exposure to blood-borne pathogen - Plan: HIV Antibody (routine testing w rflx), Hepatitis C Antibody, RPR  Fibrocystic breast changes of both breasts  Surveillance of previously prescribed intrauterine contraceptive device   P:   Pap : collected today  Mammogram: not indicated at this time  Labs: as above  Medications: Continue Kyleena, inserted 06/2017, good until 06/2022. Continue seasonique x remaining pack. May then discontinue to see if bleeding resolves itself. (fibroid uterus 1.8cm x 2.5)  Discussed weight loss to help with back pain

## 2021-01-04 ENCOUNTER — Other Ambulatory Visit (HOSPITAL_COMMUNITY)
Admission: RE | Admit: 2021-01-04 | Discharge: 2021-01-04 | Disposition: A | Discharge status: Home / Self Care | Payer: 59 | Source: Ambulatory Visit | Attending: Nurse Practitioner | Admitting: Nurse Practitioner

## 2021-01-04 ENCOUNTER — Encounter: Payer: Self-pay | Admitting: Nurse Practitioner

## 2021-01-04 ENCOUNTER — Ambulatory Visit (INDEPENDENT_AMBULATORY_CARE_PROVIDER_SITE_OTHER): Payer: 59 | Admitting: Nurse Practitioner

## 2021-01-04 ENCOUNTER — Other Ambulatory Visit: Payer: Self-pay

## 2021-01-04 VITALS — BP 116/70 | HR 70 | Resp 16 | Ht 67.5 in | Wt 225.0 lb

## 2021-01-04 DIAGNOSIS — Z01419 Encounter for gynecological examination (general) (routine) without abnormal findings: Secondary | ICD-10-CM | POA: Diagnosis not present

## 2021-01-04 DIAGNOSIS — Z114 Encounter for screening for human immunodeficiency virus [HIV]: Secondary | ICD-10-CM | POA: Diagnosis not present

## 2021-01-04 DIAGNOSIS — N6011 Diffuse cystic mastopathy of right breast: Secondary | ICD-10-CM | POA: Diagnosis not present

## 2021-01-04 DIAGNOSIS — Z7721 Contact with and (suspected) exposure to potentially hazardous body fluids: Secondary | ICD-10-CM | POA: Diagnosis not present

## 2021-01-04 DIAGNOSIS — Z30431 Encounter for routine checking of intrauterine contraceptive device: Secondary | ICD-10-CM

## 2021-01-04 DIAGNOSIS — N6012 Diffuse cystic mastopathy of left breast: Secondary | ICD-10-CM

## 2021-01-04 NOTE — Patient Instructions (Addendum)
Fibrocystic Breast Changes  Fibrocystic breast changes are changes that can make your breasts swollen or painful. These changes happen when there is scar-like tissue (fibrous tissue) in the breasts or tiny sacs of fluid (cysts) form in the breast. This is a common condition. It does not mean that you have cancer. It usually happens because of hormone changes during a monthly period. What are the causes? The exact cause of this condition is not known. However, you are more likely to have it:  If you have high levels of female hormones.  If your mother had the same condition (inherited). What are the signs or symptoms? Symptoms of this condition include:  Tenderness, swelling, mild discomfort, or pain.  Rope-like tissue that can be felt when touching the breast.  Lumps in one or both breasts.  Changes in breast size. Breasts may get larger before your period and smaller after your period.  Discharge from the nipple. How is this treated? In many cases, there is no treatment for this condition. In some cases, you may need treatment, including:  Taking medicines.  Avoiding caffeine.  Reducing the amount of sugar and fat in your diet. You may also have:  A procedure to remove fluid from a cyst.  Surgery to remove a cyst that is large or tender or does not go away.  Medicines that may lower female hormones in the body. Follow these instructions at home: Self care Check your breasts after every monthly period. If you do not have monthly periods, check your breasts on the first day of every month. Check for:  Soreness.  New swelling or puffiness.  A change in breast size.  A change in a lump that was already there. General instructions  Take over-the-counter and prescription medicines only as told by your doctor.  Wear a support or sports bra that fits well. Wear this support especially when you are exercising.  If told by your doctor, avoid or have less caffeine, fat, and  sugar in what you eat and drink.  Keep all follow-up visits as told by your doctor. This is important. Contact a doctor if:  You have fluid coming from your nipple, especially if the fluid has blood in it.  You have new lumps or bumps in your breast.  Your breast gets puffy, red, and painful.  You have changes in how your breast looks.  Your nipple looks flat or it sinks into your breast. Get help right away if:  Your breast turns red, and the redness is spreading. Summary  Fibrocystic breast changes are changes that can make your breasts swollen or painful.  This condition can happen when you have hormone changes during your monthly period.  Check your breasts after every monthly period. If you do not have monthly periods, check your breasts on the first day of every month. This information is not intended to replace advice given to you by your health care provider. Make sure you discuss any questions you have with your health care provider. Document Revised: 11/01/2019 Document Reviewed: 11/01/2019 Elsevier Patient Education  2021 [place] Maintenance, Female Adopting a healthy lifestyle and getting preventive care are important in promoting health and wellness. Ask your health care provider about:  The right schedule for you to have regular tests and exams.  Things you can do on your own to prevent diseases and keep yourself healthy. What should I know about diet, weight, and exercise? Eat a healthy diet  Eat a diet that  includes plenty of vegetables, fruits, low-fat dairy products, and lean protein.  Do not eat a lot of foods that are high in solid fats, added sugars, or sodium.   Maintain a healthy weight Body mass index (BMI) is used to identify weight problems. It estimates body fat based on height and weight. Your health care provider can help determine your BMI and help you achieve or maintain a healthy weight. Get regular exercise Get regular  exercise. This is one of the most important things you can do for your health. Most adults should:  Exercise for at least 150 minutes each week. The exercise should increase your heart rate and make you sweat (moderate-intensity exercise).  Do strengthening exercises at least twice a week. This is in addition to the moderate-intensity exercise.  Spend less time sitting. Even light physical activity can be beneficial. Watch cholesterol and blood lipids Have your blood tested for lipids and cholesterol at 34 years of age, then have this test every 5 years. Have your cholesterol levels checked more often if:  Your lipid or cholesterol levels are high.  You are older than 33 years of age.  You are at high risk for heart disease. What should I know about cancer screening? Depending on your health history and family history, you may need to have cancer screening at various ages. This may include screening for:  Breast cancer.  Cervical cancer.  Colorectal cancer.  Skin cancer.  Lung cancer. What should I know about heart disease, diabetes, and high blood pressure? Blood pressure and heart disease  High blood pressure causes heart disease and increases the risk of stroke. This is more likely to develop in people who have high blood pressure readings, are of African descent, or are overweight.  Have your blood pressure checked: ? Every 3-5 years if you are 18-39 years of age. ? Every year if you are 60 years old or older. Diabetes Have regular diabetes screenings. This checks your fasting blood sugar level. Have the screening done:  Once every three years after age 9 if you are at a normal weight and have a low risk for diabetes.  More often and at a younger age if you are overweight or have a high risk for diabetes. What should I know about preventing infection? Hepatitis B If you have a higher risk for hepatitis B, you should be screened for this virus. Talk with your health  care provider to find out if you are at risk for hepatitis B infection. Hepatitis C Testing is recommended for:  Everyone born from [birth year] through 1963.  Anyone with known risk factors for hepatitis C. Sexually transmitted infections (STIs)  Get screened for STIs, including gonorrhea and chlamydia, if: ? You are sexually active and are younger than 33 years of age. ? You are older than 33 years of age and your health care provider tells you that you are at risk for this type of infection. ? Your sexual activity has changed since you were last screened, and you are at increased risk for chlamydia or gonorrhea. Ask your health care provider if you are at risk.  Ask your health care provider about whether you are at high risk for HIV. Your health care provider may recommend a prescription medicine to help prevent HIV infection. If you choose to take medicine to prevent HIV, you should first get tested for HIV. You should then be tested every 3 months for as long as you are taking the medicine. Pregnancy  If you are about to stop having your period (premenopausal) and you may become pregnant, seek counseling before you get pregnant.  Take 400 to 800 micrograms (mcg) of folic acid every day if you become pregnant.  Ask for birth control (contraception) if you want to prevent pregnancy. Osteoporosis and menopause Osteoporosis is a disease in which the bones lose minerals and strength with aging. This can result in bone fractures. If you are 28 years old or older, or if you are at risk for osteoporosis and fractures, ask your health care provider if you should:  Be screened for bone loss.  Take a calcium or vitamin D supplement to lower your risk of fractures.  Be given hormone replacement therapy (HRT) to treat symptoms of menopause. Follow these instructions at home: Lifestyle  Do not use any products that contain nicotine or tobacco, such as cigarettes, e-cigarettes, and chewing tobacco.  If you need help quitting, ask your health care provider.  Do not use street drugs.  Do not share needles.  Ask your health care provider for help if you need support or information about quitting drugs. Alcohol use  Do not drink alcohol if: ? Your health care provider tells you not to drink. ? You are pregnant, may be pregnant, or are planning to become pregnant.  If you drink alcohol: ? Limit how much you use to 0-1 drink a day. ? Limit intake if you are breastfeeding.  Be aware of how much alcohol is in your drink. In the [place] one drink equals one 12 oz bottle of beer (355 mL), one 5 oz glass of wine (148 mL), or one 1 oz glass of hard liquor (44 mL). General instructions  Schedule regular health, dental, and eye exams.  Stay current with your vaccines.  Tell your health care provider if: ? You often feel depressed. ? You have ever been abused or do not feel safe at home. Summary  Adopting a healthy lifestyle and getting preventive care are important in promoting health and wellness.  Follow your health care provider's instructions about healthy diet, exercising, and getting tested or screened for diseases.  Follow your health care provider's instructions on monitoring your cholesterol and blood pressure. This information is not intended to replace advice given to you by your health care provider. Make sure you discuss any questions you have with your health care provider. Document Revised: 11/11/2018 Document Reviewed: 11/11/2018 Elsevier Patient Education  2021 [employer].

## 2021-01-05 LAB — COMPREHENSIVE METABOLIC PANEL
AG Ratio: 1.4 (calc) (ref 1.0–2.5)
ALT: 21 U/L (ref 6–29)
AST: 21 U/L (ref 10–30)
Albumin: 4.2 g/dL (ref 3.6–5.1)
Alkaline phosphatase (APISO): 35 U/L (ref 31–125)
BUN: 17 mg/dL (ref 7–25)
CO2: 22 mmol/L (ref 20–32)
Calcium: 10 mg/dL (ref 8.6–10.2)
Chloride: 105 mmol/L (ref 98–110)
Creat: 0.84 mg/dL (ref 0.50–1.10)
Globulin: 3 g/dL (calc) (ref 1.9–3.7)
Glucose, Bld: 91 mg/dL (ref 65–99)
Potassium: 4.3 mmol/L (ref 3.5–5.3)
Sodium: 139 mmol/L (ref 135–146)
Total Bilirubin: 0.4 mg/dL (ref 0.2–1.2)
Total Protein: 7.2 g/dL (ref 6.1–8.1)

## 2021-01-05 LAB — CBC
HCT: 40.1 % (ref 35.0–45.0)
Hemoglobin: 13.2 g/dL (ref 11.7–15.5)
MCH: 28.6 pg (ref 27.0–33.0)
MCHC: 32.9 g/dL (ref 32.0–36.0)
MCV: 87 fL (ref 80.0–100.0)
MPV: 9.1 fL (ref 7.5–12.5)
Platelets: 316 10*3/uL (ref 140–400)
RBC: 4.61 10*6/uL (ref 3.80–5.10)
RDW: 12.6 % (ref 11.0–15.0)
WBC: 6.8 10*3/uL (ref 3.8–10.8)

## 2021-01-05 LAB — LIPID PANEL
Cholesterol: 207 mg/dL — ABNORMAL HIGH (ref <200)
HDL: 60 mg/dL (ref >=50)
LDL Cholesterol (Calc): 129 mg/dL (calc) — ABNORMAL HIGH
Non-HDL Cholesterol (Calc): 147 mg/dL (calc) — ABNORMAL HIGH (ref <130)
Total CHOL/HDL Ratio: 3.5 (calc) (ref <5.0)
Triglycerides: 85 mg/dL (ref <150)

## 2021-01-05 LAB — RPR: RPR Ser Ql: NONREACTIVE

## 2021-01-05 LAB — HEPATITIS C ANTIBODY
Hepatitis C Ab: NONREACTIVE
SIGNAL TO CUT-OFF: 0.01 (ref <1.00)

## 2021-01-05 LAB — HIV ANTIBODY (ROUTINE TESTING W REFLEX): HIV 1&2 Ab, 4th Generation: NONREACTIVE

## 2021-01-08 ENCOUNTER — Other Ambulatory Visit: Payer: Self-pay | Admitting: Nurse Practitioner

## 2021-01-08 DIAGNOSIS — B379 Candidiasis, unspecified: Secondary | ICD-10-CM

## 2021-01-08 LAB — CYTOLOGY - PAP
Comment: NEGATIVE
Diagnosis: NEGATIVE
High risk HPV: NEGATIVE

## 2021-01-08 MED ORDER — FLUCONAZOLE 150 MG PO TABS: 150.0000 mg | ORAL_TABLET | Freq: Once | ORAL | 0 refills | Status: AC

## 2021-02-12 NOTE — Progress Notes (Unsigned)
GYNECOLOGY  VISIT  CC:   Breast re-check  HPI: 35 y.o. G0P0000 Single Black or [place] American female here for breast recheck. On 01/04/2021, pt had annual and patient was found to have fibrocystic breasts, a more dominant lump was palpable at 5-6 o'clock, 1 cm in size, about 1 cm from areola. Pt states that she checks periodically and it doesn't seem to have changed to her  Irregular bleeding has subsided (has Kyleena), no problems since starting [place].  States she still feels the lump in left breast, it hasn't changed  Period Duration (Days): 2-3 Period Pattern: Regular Menstrual Flow: Light Menstrual Control: Tampon Dysmenorrhea: (!) Moderate Dysmenorrhea Symptoms: Cramping,Headache  GYNECOLOGIC HISTORY: Patient's last menstrual period was 01/25/2021 (exact date). Contraception: kyleena placed 06-12-17 Menopausal hormone therapy: none  Patient Active Problem List   Diagnosis Date Noted  . IUD (intrauterine device) in place 07/24/2017  . Fibroids, intramural 07/24/2017  . Obesity (BMI 30.0-34.9) 07/15/2017  . Other postablative hypothyroidism 07/12/2013  . Toxic diffuse goiter 08/03/2009    Past Medical History:  Diagnosis Date  . Thyroid disease     Past Surgical History:  Procedure Laterality Date  . Verdia [place] iud     inserted 06-12-17    MEDS:   Current Outpatient Medications on File Prior to Visit  Medication Sig Dispense Refill  . Levonorgestrel (KYLEENA IU) by Intrauterine route. Placed 06/12/17    . Levonorgestrel-Ethinyl Estradiol (SEASONIQUE) 0.15-0.03 &0.01 MG tablet Take 1 tablet by mouth daily. 91 tablet 0  . levothyroxine (SYNTHROID) 200 MCG tablet Take 1 tablet by mouth daily.     No current facility-administered medications on file prior to visit.    ALLERGIES: Patient has no known allergies.  Family History  Problem Relation Age of Onset  . Thyroid disease Mother   . Thyroid disease Sister   . Down syndrome Sister   . Diabetes Paternal  Aunt   . Diabetes Paternal Uncle   . Lung cancer Maternal Grandfather   . Prostate cancer Paternal Grandfather      Review of Systems  PHYSICAL EXAMINATION:    BP 96/62   Pulse 68   Resp 16   Wt 227 lb (103 kg)   LMP 01/25/2021 (Exact Date)   BMI 35.03 kg/m     General appearance: alert, cooperative, no acute distress  Breasts: fibrocystic bilaterally, more so in left breast. More dominant lump palpable (unchanged from 01/04/2021 exam). It is in the 5-6 o'clock position, 1 cm in size, 1 cm from areola   Assessment: Persistent breast lump Fibrocystic breast  Plan: Mammogram and Breast US left breast

## 2021-02-15 ENCOUNTER — Other Ambulatory Visit: Payer: Self-pay

## 2021-02-15 ENCOUNTER — Ambulatory Visit (INDEPENDENT_AMBULATORY_CARE_PROVIDER_SITE_OTHER): Payer: 59 | Admitting: Nurse Practitioner

## 2021-02-15 ENCOUNTER — Encounter: Payer: Self-pay | Admitting: Nurse Practitioner

## 2021-02-15 ENCOUNTER — Telehealth: Payer: Self-pay | Admitting: *Deleted

## 2021-02-15 VITALS — BP 96/62 | HR 68 | Resp 16 | Wt 227.0 lb

## 2021-02-15 DIAGNOSIS — N63 Unspecified lump in unspecified breast: Secondary | ICD-10-CM

## 2021-02-15 DIAGNOSIS — N6012 Diffuse cystic mastopathy of left breast: Secondary | ICD-10-CM | POA: Diagnosis not present

## 2021-02-15 DIAGNOSIS — N6011 Diffuse cystic mastopathy of right breast: Secondary | ICD-10-CM | POA: Diagnosis not present

## 2021-02-15 NOTE — Telephone Encounter (Signed)
I called and left detailed message on cell per DPR access to call me to discuss scheduling at the breast center verses [facility] [place].  Breast center are scheduling diag mammograms in May for now. [facility] would be able to see patient sooner,patient works for [employer].  Wanted to give patient a option.

## 2021-02-15 NOTE — Patient Instructions (Signed)
Fibrocystic Breast Changes  Fibrocystic breast changes are changes that can make your breasts swollen or painful. These changes happen when there is scar-like tissue (fibrous tissue) in the breasts or tiny sacs of fluid (cysts) form in the breast. This is a common condition. It does not mean that you have cancer. It usually happens because of hormone changes during a monthly period. What are the causes? The exact cause of this condition is not known. However, you are more likely to have it:  If you have high levels of female hormones.  If your mother had the same condition (inherited). What are the signs or symptoms? Symptoms of this condition include:  Tenderness, swelling, mild discomfort, or pain.  Rope-like tissue that can be felt when touching the breast.  Lumps in one or both breasts.  Changes in breast size. Breasts may get larger before your period and smaller after your period.  Discharge from the nipple. How is this treated? In many cases, there is no treatment for this condition. In some cases, you may need treatment, including:  Taking medicines.  Avoiding caffeine.  Reducing the amount of sugar and fat in your diet. You may also have:  A procedure to remove fluid from a cyst.  Surgery to remove a cyst that is large or tender or does not go away.  Medicines that may lower female hormones in the body. Follow these instructions at home: Self care Check your breasts after every monthly period. If you do not have monthly periods, check your breasts on the first day of every month. Check for:  Soreness.  New swelling or puffiness.  A change in breast size.  A change in a lump that was already there. General instructions  Take over-the-counter and prescription medicines only as told by your doctor.  Wear a support or sports bra that fits well. Wear this support especially when you are exercising.  If told by your doctor, avoid or have less caffeine, fat, and  sugar in what you eat and drink.  Keep all follow-up visits as told by your doctor. This is important. Contact a doctor if:  You have fluid coming from your nipple, especially if the fluid has blood in it.  You have new lumps or bumps in your breast.  Your breast gets puffy, red, and painful.  You have changes in how your breast looks.  Your nipple looks flat or it sinks into your breast. Get help right away if:  Your breast turns red, and the redness is spreading. Summary  Fibrocystic breast changes are changes that can make your breasts swollen or painful.  This condition can happen when you have hormone changes during your monthly period.  Check your breasts after every monthly period. If you do not have monthly periods, check your breasts on the first day of every month. This information is not intended to replace advice given to you by your health care provider. Make sure you discuss any questions you have with your health care provider. Document Revised: 11/01/2019 Document Reviewed: 11/01/2019 Elsevier Patient Education  2021 Elsevier Inc.  

## 2021-02-15 NOTE — Telephone Encounter (Signed)
-----   Message from Karma Ganja, NP sent at 02/15/2021 10:24 AM EDT ----- Please schedule left breast US (and I think since she is over 30 they will require mammogram) She has a 1cm lump, left breast 5-6 o'clock position about 1 cm from areola. This is persistent from annual exam, felt 6 weeks ago. She also has bilateral dense fibrocystic breasts

## 2021-02-16 NOTE — Telephone Encounter (Signed)
Pt called wanted to rely message to Anderson Malta she will do her mammogram with [facility] and to please call her when its done.

## 2021-02-21 NOTE — Telephone Encounter (Addendum)
Patient scheduled on 02/23/21 @ 2:00pm at [place]. Patient informed.

## 2021-05-29 ENCOUNTER — Other Ambulatory Visit: Payer: Self-pay

## 2021-05-29 DIAGNOSIS — Z30433 Encounter for removal and reinsertion of intrauterine contraceptive device: Secondary | ICD-10-CM

## 2021-06-28 NOTE — Progress Notes (Signed)
GYNECOLOGY  VISIT   HPI: 35 y.o.   Single  African American  female   G0P0000 with No LMP recorded. (Menstrual status: IUD).   here for [facility] IUD removal and reinsertion.   Cycle started 06/03/21 and it lasted for 2 weeks.  Started again 4 days ago and has just stopped.   She did a pregnancy test last week and it was negative.  She got her period following this.  States she has done 7 negative UPTs at home.  Neg GC/CT 12/28/20. No partner change since this testing.   Has fibroid. See [place] 12/25/2017.  IUD in endometrial canal.   States her IUD strings were shortened twice.   GYNECOLOGIC HISTORY: No LMP recorded. (Menstrual status: IUD). Contraception:  Kyleena IUD 06-12-17 Menopausal hormone therapy:  none Last mammogram: n/a Last pap smear: 01-04-21 Neg:Neg HR HPV, 02-26-17 Neg        OB History     Gravida  0   Para  0   Term  0   Preterm  0   AB  0   Living  0      SAB  0   IAB  0   Ectopic  0   Multiple  0   Live Births  0              Patient Active Problem List   Diagnosis Date Noted   IUD (intrauterine device) in place 07/24/2017   Fibroids, intramural 07/24/2017   Obesity (BMI 30.0-34.9) 07/15/2017   Other postablative hypothyroidism 07/12/2013   Toxic diffuse goiter 08/03/2009    Past Medical History:  Diagnosis Date   Thyroid disease     Past Surgical History:  Procedure Laterality Date   kyleena iud     inserted 06-12-17    Current Outpatient Medications  Medication Sig Dispense Refill   Levonorgestrel (KYLEENA IU) by Intrauterine route. Placed 06/12/17     levothyroxine (SYNTHROID) 112 MCG tablet Take 224 mcg by mouth daily.     No current facility-administered medications for this visit.     ALLERGIES: Patient has no known allergies.  Family History  Problem Relation Age of Onset   Thyroid disease Mother    Thyroid disease Sister    Down syndrome Sister    Diabetes Paternal Aunt    Diabetes Paternal Uncle    Lung  cancer Maternal Grandfather    Prostate cancer Paternal Grandfather     Social History   Socioeconomic History   Marital status: Single    Spouse name: Not on file   Number of children: Not on file   Years of education: Not on file   Highest education level: Not on file  Occupational History   Not on file  Tobacco Use   Smoking status: Never   Smokeless tobacco: Never  Vaping Use   Vaping Use: Never used  Substance and Sexual Activity   Alcohol use: Yes    Alcohol/week: 3.0 standard drinks    Types: 3 Glasses of wine per week    Comment: occasional   Drug use: No   Sexual activity: Not Currently    Birth control/protection: I.U.D.    Comment: Kyleena placed 06/12/17   Other Topics Concern   Not on file  Social History Narrative   Not on file   Social Determinants of Health   Financial Resource Strain: Not on file  Food Insecurity: Not on file  Transportation Needs: Not on file  Physical Activity: Not on file  Stress: Not on file  Social Connections: Not on file  Intimate Partner Violence: Not on file    Review of Systems  All other systems reviewed and are negative.  PHYSICAL EXAMINATION:    BP 118/74   Pulse 89   Ht 5' 7.5" (1.715 m)   Wt 222 lb (100.7 kg)   SpO2 98%   BMI 34.26 kg/m     General appearance: alert, cooperative and appears stated age   Pelvic: External genitalia:  no lesions              Urethra:  normal appearing urethra with no masses, tenderness or lesions              Bartholins and Skenes: normal                 Vagina: normal appearing vagina with normal color and discharge, no lesions              Cervix: no lesions.  IUD strings not seen.                 Bimanual Exam:  Uterus:  normal size, contour, position, consistency, mobility, non-tender              Adnexa: no mass, fullness, tenderness               IUD removal and reinsertion of new [place] IUD - lot [id number], exp Nov 2023. Consent for procedures.  Sterile prep of  cervix with Hibiclens.  Tenaculum to anterior cervical lip.  Parcervical block 10 cc 1% lidocaine, lot [id number], exp 03/2023.  Os finder used. Dressing forceps used to remove IUD intact, shown to patient, and discarded.  Uterus sounded to 6.5 cm.  Kyleena IUD placed without difficulty  Strings trimmed.  Repeat bimanual exam, no change.  No complications.  Minimal EBL.  Chaperone was present for exam: Estill Bamberg, CMA.   ASSESSMENT  Removal and reinsertion of Kyleena IUD.  PLAN  Back up protection for 1 week.  Post IUD insertion precautions given.  New IUD card to patient.  FU in 4 weeks, sooner if needed.

## 2021-07-02 IMAGING — DX DG ANKLE COMPLETE 3+V*L*
3 series · 3 of 3 positions shown · non-contrast
Comparison: None.

CLINICAL DATA: Left ankle pain.  Felt a pop.

EXAM:
LEFT ANKLE COMPLETE - 3+ VIEW

[ankle ap]
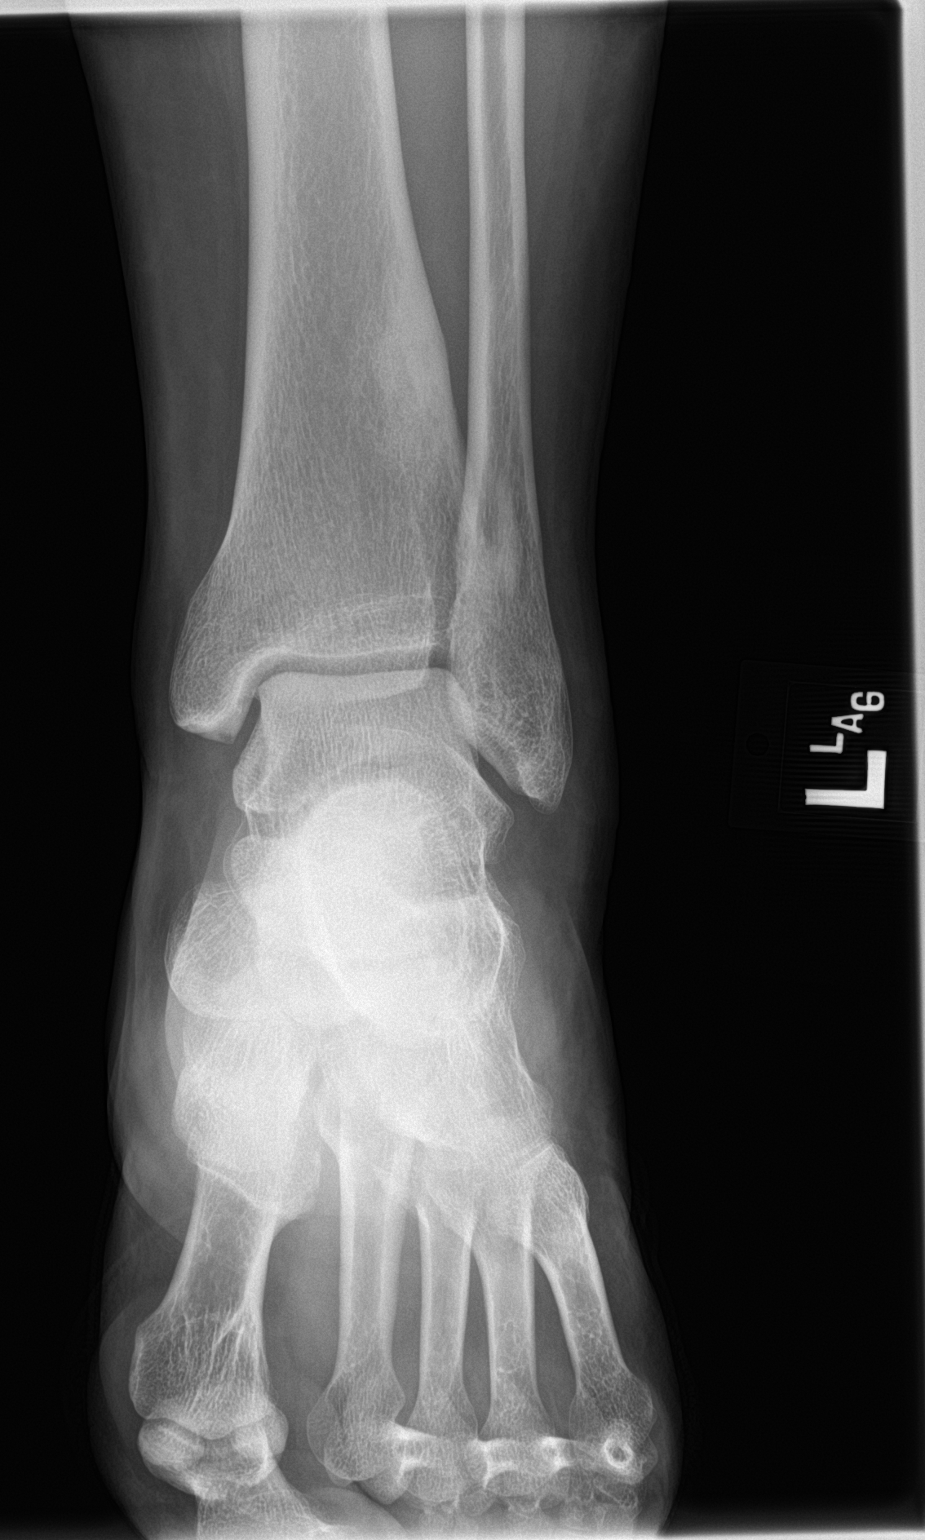

[ankle obl]
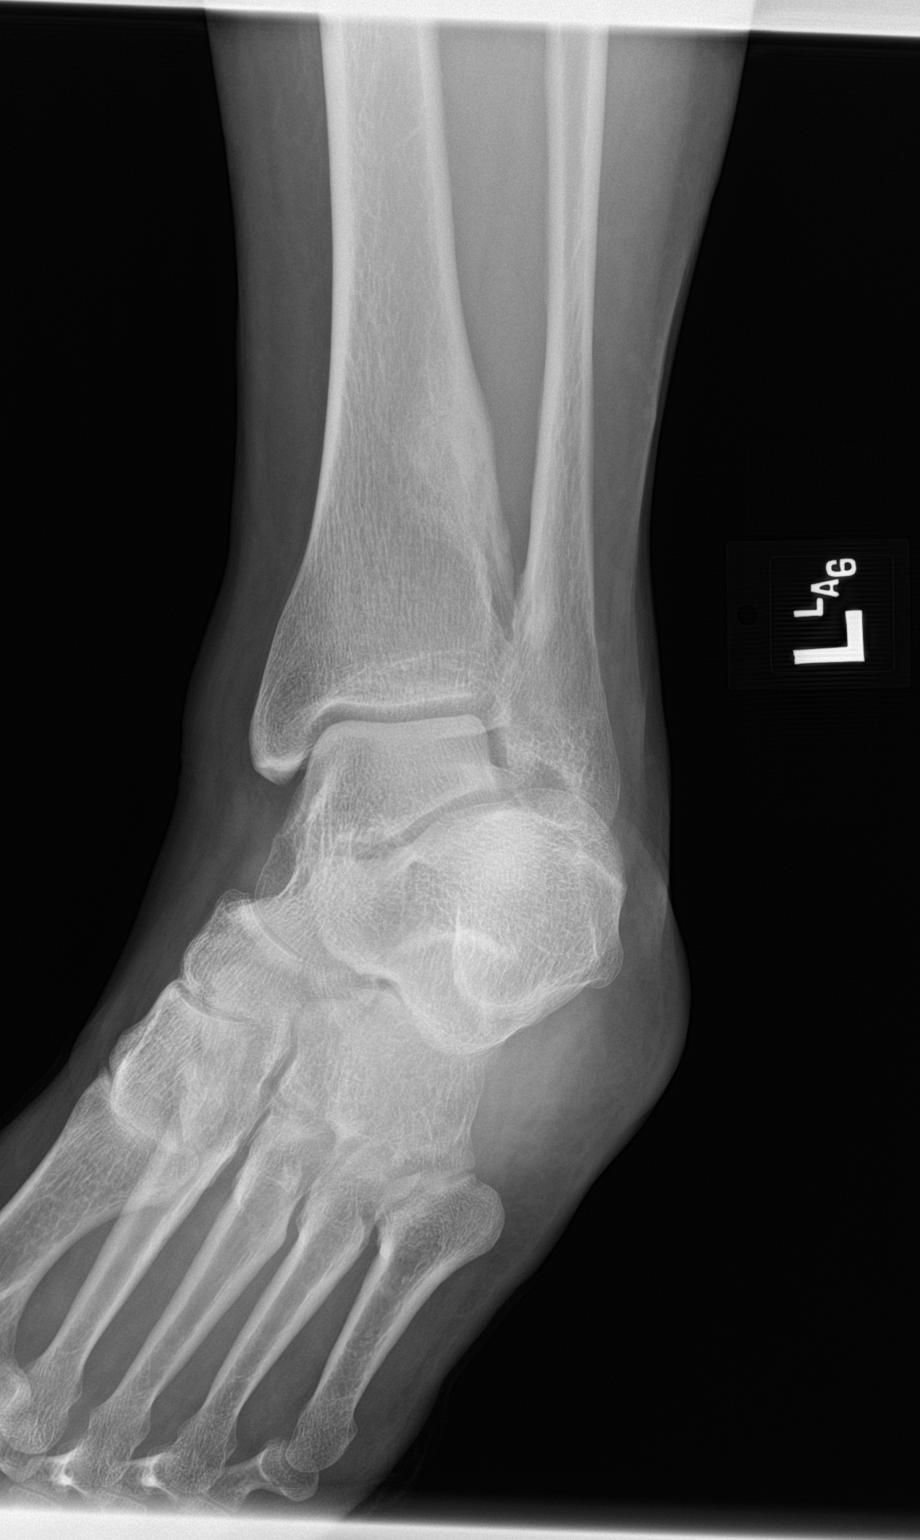

[ankle lat]
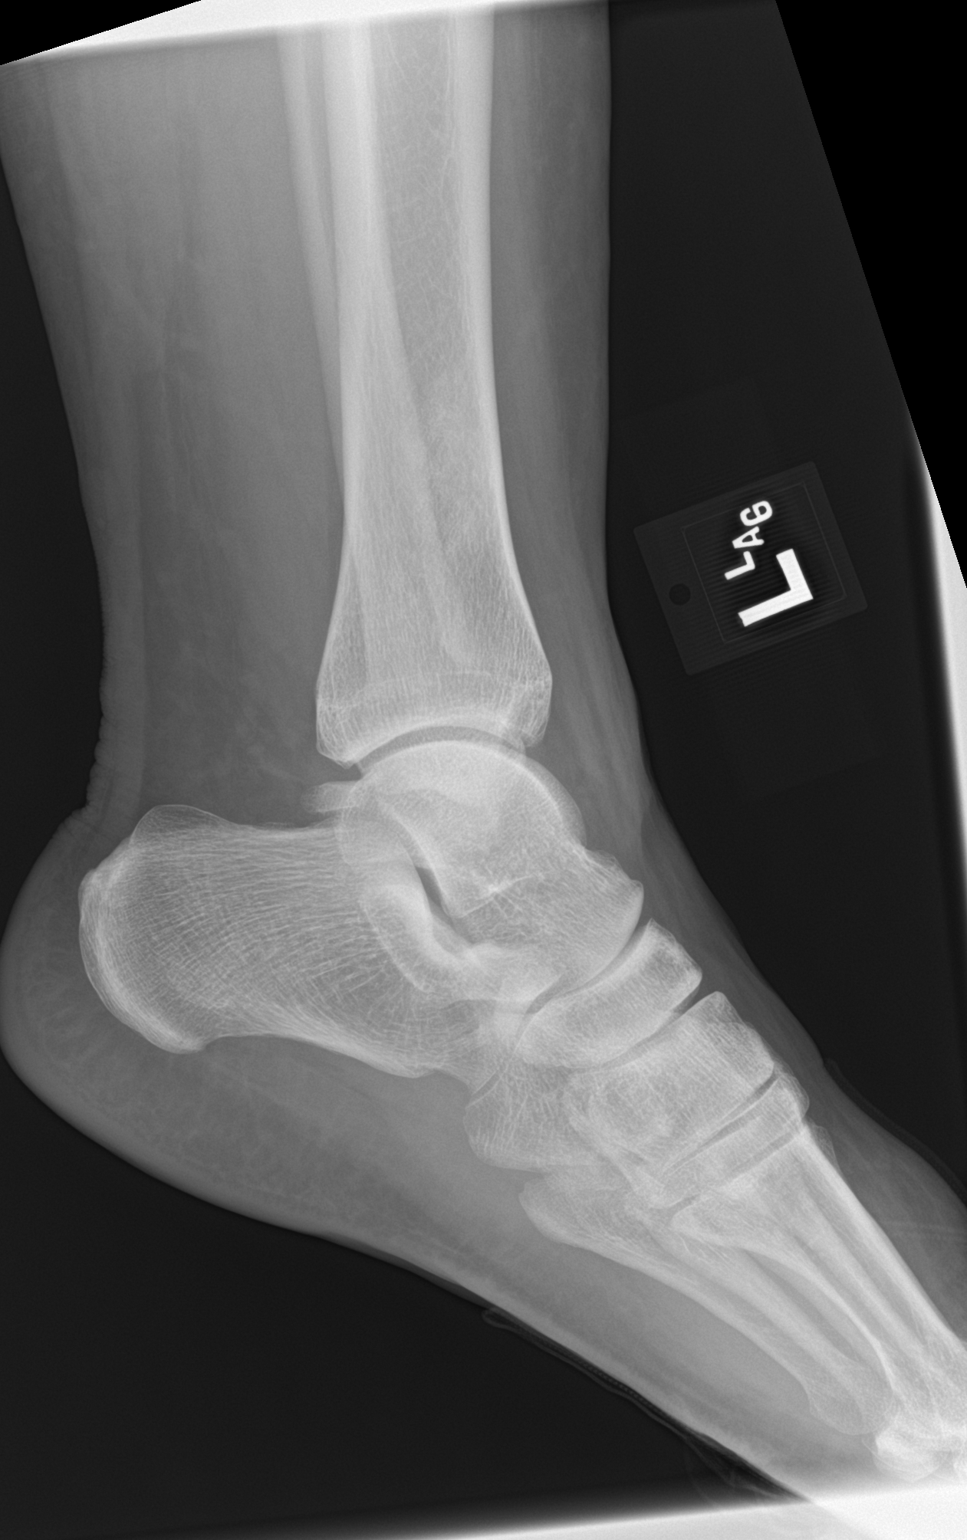

[3 of 3 positions shown; findings below may reference images not displayed]

FINDINGS: No evidence of fracture, dislocation, or joint effusion. No widening
of the medial or lateral clear spaces on Mortise view. no evidence
of severe arthropathy. Sclerotic cortical lesion along the medial
aspect of the distal tibia likely represents a sclerosed NOF. No
aggressive appearing focal bone abnormality. Soft tissues are
unremarkable.
IMPRESSION: No acute displaced fracture or dislocation.

## 2021-07-06 ENCOUNTER — Ambulatory Visit (INDEPENDENT_AMBULATORY_CARE_PROVIDER_SITE_OTHER): Payer: 59 | Admitting: Obstetrics and Gynecology

## 2021-07-06 ENCOUNTER — Other Ambulatory Visit: Payer: Self-pay

## 2021-07-06 ENCOUNTER — Encounter: Payer: Self-pay | Admitting: Obstetrics and Gynecology

## 2021-07-06 VITALS — BP 118/74 | HR 89 | Ht 67.5 in | Wt 222.0 lb

## 2021-07-06 DIAGNOSIS — Z30433 Encounter for removal and reinsertion of intrauterine contraceptive device: Secondary | ICD-10-CM

## 2021-07-06 DIAGNOSIS — Z01812 Encounter for preprocedural laboratory examination: Secondary | ICD-10-CM

## 2021-07-06 NOTE — Patient Instructions (Signed)
Intrauterine Device Insertion, Care After This sheet gives you information about how to care for yourself after your procedure. Your health care provider may also give you more specific instructions. If you have problems or questions, contact your health care provider. What can I expect after the procedure? After the procedure, it is common to have: Cramps and pain in the abdomen. Bleeding. It may be light or heavy. This may last for a few days. Lower back pain. Dizziness. Headaches. Nausea. Follow these instructions at home:  Before resuming sexual activity, check to make sure that you can feel the IUD string or strings. You should be able to feel the end of the string below the opening of your cervix. If your IUD string is in place, you may resume sexual activity. If you had a hormonal IUD inserted more than 7 days after your most recent period started, you will need to use a backup method of birth control for 7 days after IUD insertion. Ask your health care provider whether this applies to you. Continue to check that the IUD is still in place by feeling for the strings after every menstrual period, or once a month. An IUD will not protect you from sexually transmitted infections (STIs). Use methods to prevent the exchange of body fluids between partners (barrier protection) every time you have sex. Barrier protection can be used during oral, vaginal, or anal sex. Commonly used barrier methods include: Female condom. Female condom. Dental dam. Take over-the-counter and prescription medicines only as told by your health care provider. Keep all follow-up visits as told by your health care provider. This is important. Contact a health care provider if: You feel light-headed or weak. You have any of the following problems with your IUD string or strings: The string bothers or hurts you or your sexual partner. You cannot feel the string. The string has gotten longer. You can feel the IUD in  your vagina. You think you may be pregnant, or you miss your menstrual period. You think you may have a sexually transmitted infection (STI). Get help right away if: You have flu-like symptoms, such as tiredness (fatigue) and muscle aches. You have a fever and chills. You have bleeding that is heavier or lasts longer than a normal menstrual cycle. You have abnormal or bad-smelling discharge from your vagina. You develop abdominal pain that is new, is getting worse, or is not in the same area of earlier cramping and pain. You have pain during sexual activity. Summary After the procedure, it is common to have cramps and pain in the abdomen. It is also common to have light bleeding or heavier bleeding that is like your menstrual period. Continue to check that the IUD is still in place by feeling for the strings after every menstrual period, or once a month. Keep all follow-up visits as told by your health care provider. This is important. Contact your health care provider if you have problems with your IUD strings, such as the string getting longer or bothering you or your sexual partner. This information is not intended to replace advice given to you by your health care provider. Make sure you discuss any questions you have with your health care provider. Document Revised: 11/09/2019 Document Reviewed: 11/09/2019 Elsevier Patient Education  2022 Elsevier Inc.  

## 2021-07-09 LAB — PREGNANCY, URINE: Preg Test, Ur: NEGATIVE

## 2021-08-01 ENCOUNTER — Ambulatory Visit: Payer: 59 | Admitting: Obstetrics and Gynecology

## 2021-08-08 ENCOUNTER — Encounter: Payer: Self-pay | Admitting: Obstetrics and Gynecology

## 2021-08-08 ENCOUNTER — Other Ambulatory Visit: Payer: Self-pay

## 2021-08-08 ENCOUNTER — Ambulatory Visit (INDEPENDENT_AMBULATORY_CARE_PROVIDER_SITE_OTHER): Payer: 59 | Admitting: Obstetrics and Gynecology

## 2021-08-08 VITALS — BP 100/60 | HR 88 | Ht 67.5 in | Wt 222.0 lb

## 2021-08-08 DIAGNOSIS — Z30431 Encounter for routine checking of intrauterine contraceptive device: Secondary | ICD-10-CM

## 2021-08-08 NOTE — Progress Notes (Signed)
GYNECOLOGY  VISIT   HPI: 35 y.o.   Single  African American  female   [place] with Patient's last menstrual period was 08/03/2021 (exact date).   here for 4 weeks follow up after [facility] IUD inserted.  First period was light, shorter, and less painful.   Checked her IUD and she was able to find the strings.   Not sexually active since the IUD was placed.   Likes to use a Teacher, music.   GYNECOLOGIC HISTORY: Patient's last menstrual period was 08/03/2021 (exact date). Contraception:  Kyleena IUD 07/06/21 Menopausal hormone therapy:  n/a Last mammogram:  n/a Last pap smear:   01-04-21 Neg:Neg HR HPV, 02-26-17 Neg               OB History     Gravida  0   Para  0   Term  0   Preterm  0   AB  0   Living  0      SAB  0   IAB  0   Ectopic  0   Multiple  0   Live Births  0              Patient Active Problem List   Diagnosis Date Noted   IUD (intrauterine device) in place 07/24/2017   Fibroids, intramural 07/24/2017   Obesity (BMI 30.0-34.9) 07/15/2017   Other postablative hypothyroidism 07/12/2013   Toxic diffuse goiter 08/03/2009    Past Medical History:  Diagnosis Date   Thyroid disease     Past Surgical History:  Procedure Laterality Date   kyleena iud     inserted 06-12-17    Current Outpatient Medications  Medication Sig Dispense Refill   Levonorgestrel (KYLEENA IU) by Intrauterine route. Placed 06/12/17     levothyroxine (SYNTHROID) 112 MCG tablet Take 224 mcg by mouth daily.     No current facility-administered medications for this visit.     ALLERGIES: Patient has no known allergies.  Family History  Problem Relation Age of Onset   Thyroid disease Mother    Thyroid disease Sister    Down syndrome Sister    Diabetes Paternal Aunt    Diabetes Paternal Uncle    Lung cancer Maternal Grandfather    Prostate cancer Paternal Grandfather     Social History   Socioeconomic History   Marital status: Single    Spouse name: Not on file    Number of children: Not on file   Years of education: Not on file   Highest education level: Not on file  Occupational History   Not on file  Tobacco Use   Smoking status: Never   Smokeless tobacco: Never  Vaping Use   Vaping Use: Never used  Substance and Sexual Activity   Alcohol use: Yes    Alcohol/week: 3.0 standard drinks    Types: 3 Glasses of wine per week    Comment: occasional   Drug use: No   Sexual activity: Not Currently    Birth control/protection: I.U.D.    Comment: Kyleena placed 06/12/17   Other Topics Concern   Not on file  Social History Narrative   Not on file   Social Determinants of Health   Financial Resource Strain: Not on file  Food Insecurity: Not on file  Transportation Needs: Not on file  Physical Activity: Not on file  Stress: Not on file  Social Connections: Not on file  Intimate Partner Violence: Not on file    Review of Systems  All other systems reviewed and are negative.  PHYSICAL EXAMINATION:    BP 100/60   Pulse 88   Ht 5' 7.5" (1.715 m)   Wt 222 lb (100.7 kg)   LMP 08/03/2021 (Exact Date)   SpO2 97%   BMI 34.26 kg/m     General appearance: alert, cooperative and appears stated age   Pelvic: External genitalia:  no lesions              Urethra:  normal appearing urethra with no masses, tenderness or lesions              Bartholins and Skenes: normal                 Vagina: normal appearing vagina with normal color and discharge, no lesions              Cervix: no lesions.  IUD strings noted to be about 2 cm length.                 Bimanual Exam:  Uterus:  normal size, contour, position, consistency, mobility, non-tender              Adnexa: no mass, fullness, tenderness               Chaperone was present for exam:  Estill Bamberg, CMA  ASSESSMENT  Kyleena IUD check up.  Doing well.  PLAN  Contraception, bleeding profile, and 5 year efficacy of Kyleena reviewed.  We discussed Mirena IUD has been approved for 8 years of  use.  FU for annual exam and prn.    An After Visit Summary was printed and given to the patient.

## 2021-12-07 DIAGNOSIS — U071 COVID-19: Secondary | ICD-10-CM | POA: Insufficient documentation

## 2022-01-09 NOTE — Progress Notes (Signed)
36 y.o. G0P0000 Single African American female here for annual exam.    Happy with [place] IUD.  Menses last 2 days.  Heavy cramping first day.  No NSAID use.   Accepts STD screening today.   Taking phentermine.   Going to [employer].  Working her and in [place].  PCP:   None Endocrinology:  [facility]  No LMP recorded. (Menstrual status: IUD).     Period Pattern:  (no bleeding with iud)     Sexually active: No.  The current method of family planning is IUD--Kyleena 07/06/21.    Exercising: Yes.     2-3 times a week Smoker:  no  Health Maintenance: Pap:  01-04-21 Neg:Neg HR HPV, 02-26-17 neg History of abnormal Pap:  no MMG:  02-23-21 (care everywhere) bilateral & left breast u/s neg Colonoscopy:  n/a BMD:   n/a  Result  n/a TDaP:  2021 Gardasil:   no HIV: 01-04-21 NR Hep C: 01-04-21 Neg Screening Labs:  endocrinology  Flu vaccine:  completed, October Covid bivalent booster:  October   reports that she has never smoked. She has never used smokeless tobacco. She reports current alcohol use of about 3.0 standard drinks per week. She reports that she does not use drugs.  Past Medical History:  Diagnosis Date   Thyroid disease     Past Surgical History:  Procedure Laterality Date   kyleena iud     inserted 06-12-17, removed & re-inserted 07-06-21    Current Outpatient Medications  Medication Sig Dispense Refill   Levonorgestrel (KYLEENA IU) by Intrauterine route. Placed 06/12/17     levothyroxine (SYNTHROID) 200 MCG tablet Take 200 mcg by mouth daily.     phentermine 15 MG capsule Take 15 mg by mouth every morning.     No current facility-administered medications for this visit.    Family History  Problem Relation Age of Onset   Thyroid disease Mother    Thyroid disease Sister    Down syndrome Sister    Diabetes Paternal Aunt    Diabetes Paternal Uncle    Lung cancer Maternal Grandfather    Prostate cancer Paternal Grandfather     Review of Systems   Constitutional: Negative.   HENT: Negative.    Eyes: Negative.   Respiratory: Negative.    Cardiovascular: Negative.   Gastrointestinal: Negative.   Endocrine: Negative.   Genitourinary: Negative.   Musculoskeletal: Negative.   Skin: Negative.   Allergic/Immunologic: Negative.   Neurological: Negative.   Hematological: Negative.   Psychiatric/Behavioral: Negative.     Exam:   BP 116/78    Pulse 78    Resp 16    Ht 5' 6.75" (1.695 m)    Wt 213 lb (96.6 kg)    BMI 33.61 kg/m     General appearance: alert, cooperative and appears stated age Head: normocephalic, without obvious abnormality, atraumatic Neck: no adenopathy, supple, symmetrical, trachea midline and thyroid normal to inspection and palpation Lungs: clear to auscultation bilaterally Breasts: normal appearance, no masses or tenderness, No nipple retraction or dimpling, No nipple discharge or bleeding, No axillary adenopathy Heart: regular rate and rhythm Abdomen: soft, non-tender; no masses, no organomegaly Extremities: extremities normal, atraumatic, no cyanosis or edema Skin: skin color, texture, turgor normal. No rashes or lesions Lymph nodes: cervical, supraclavicular, and axillary nodes normal. Neurologic: grossly normal  Pelvic: External genitalia:  no lesions              No abnormal inguinal nodes palpated.  Urethra:  normal appearing urethra with no masses, tenderness or lesions              Bartholins and Skenes: normal                 Vagina: normal appearing vagina with normal color and discharge, no lesions              Cervix: no lesions.  IUD strings noted.               Pap taken: no Bimanual Exam:  Uterus:  normal size, contour, position, consistency, mobility, non-tender              Adnexa: no mass, fullness, tenderness    Chaperone was present for exam:  Estill Bamberg, CMA  Assessment:   Well woman visit with gynecologic exam. Kyleena IUD, 2022.   Plan: Mammogram screening  discussed. Self breast awareness reviewed. Pap and HR HPV as above. Guidelines for Calcium, Vitamin D, regular exercise program including cardiovascular and weight bearing exercise. Start Gardasil series.  Check for GC/CT/trich/HIV, RPR, Hep C aby.  Follow up annually and prn.   After visit summary provided.

## 2022-01-10 ENCOUNTER — Other Ambulatory Visit (HOSPITAL_COMMUNITY)
Admission: RE | Admit: 2022-01-10 | Discharge: 2022-01-10 | Disposition: A | Discharge status: Home / Self Care | Payer: 59 | Source: Ambulatory Visit | Attending: Obstetrics and Gynecology | Admitting: Obstetrics and Gynecology

## 2022-01-10 ENCOUNTER — Encounter: Payer: Self-pay | Admitting: Obstetrics and Gynecology

## 2022-01-10 ENCOUNTER — Other Ambulatory Visit: Payer: Self-pay

## 2022-01-10 ENCOUNTER — Ambulatory Visit (INDEPENDENT_AMBULATORY_CARE_PROVIDER_SITE_OTHER): Payer: 59 | Admitting: Obstetrics and Gynecology

## 2022-01-10 VITALS — BP 116/78 | HR 78 | Resp 16 | Ht 66.75 in | Wt 213.0 lb

## 2022-01-10 DIAGNOSIS — Z23 Encounter for immunization: Secondary | ICD-10-CM | POA: Diagnosis not present

## 2022-01-10 DIAGNOSIS — Z01419 Encounter for gynecological examination (general) (routine) without abnormal findings: Secondary | ICD-10-CM

## 2022-01-10 DIAGNOSIS — Z113 Encounter for screening for infections with a predominantly sexual mode of transmission: Secondary | ICD-10-CM | POA: Insufficient documentation

## 2022-01-10 DIAGNOSIS — Z119 Encounter for screening for infectious and parasitic diseases, unspecified: Secondary | ICD-10-CM | POA: Diagnosis not present

## 2022-01-10 NOTE — Patient Instructions (Signed)

## 2022-01-11 LAB — CERVICOVAGINAL ANCILLARY ONLY
Chlamydia: NEGATIVE
Comment: NEGATIVE
Comment: NEGATIVE
Comment: NORMAL
Neisseria Gonorrhea: NEGATIVE
Trichomonas: NEGATIVE

## 2022-01-11 LAB — HEPATITIS C ANTIBODY
Hepatitis C Ab: NONREACTIVE
SIGNAL TO CUT-OFF: 0.06 (ref <1.00)

## 2022-01-11 LAB — RPR: RPR Ser Ql: NONREACTIVE

## 2022-01-11 LAB — HIV ANTIBODY (ROUTINE TESTING W REFLEX): HIV 1&2 Ab, 4th Generation: NONREACTIVE

## 2022-03-11 ENCOUNTER — Ambulatory Visit: Payer: 59

## 2022-03-13 ENCOUNTER — Ambulatory Visit (INDEPENDENT_AMBULATORY_CARE_PROVIDER_SITE_OTHER): Payer: Commercial Managed Care - PPO | Admitting: *Deleted

## 2022-03-13 DIAGNOSIS — Z23 Encounter for immunization: Secondary | ICD-10-CM

## 2022-06-15 ENCOUNTER — Telehealth: Payer: Self-pay | Admitting: Family

## 2022-06-15 DIAGNOSIS — B3731 Acute candidiasis of vulva and vagina: Secondary | ICD-10-CM

## 2022-06-16 MED ORDER — FLUCONAZOLE 150 MG PO TABS: 150.0000 mg | ORAL_TABLET | ORAL | 0 refills | Status: DC | PRN

## 2022-06-16 NOTE — Progress Notes (Signed)

## 2022-07-15 ENCOUNTER — Ambulatory Visit: Payer: Commercial Managed Care - PPO

## 2022-11-10 ENCOUNTER — Ambulatory Visit (HOSPITAL_COMMUNITY)
Admission: EM | Admit: 2022-11-10 | Discharge: 2022-11-10 | Disposition: A | Discharge status: Home / Self Care | Payer: PRIVATE HEALTH INSURANCE | Attending: Family Medicine | Admitting: Family Medicine

## 2022-11-10 ENCOUNTER — Encounter (HOSPITAL_COMMUNITY): Payer: Self-pay

## 2022-11-10 ENCOUNTER — Ambulatory Visit: Admit: 2022-11-10 | Disposition: A | Discharge status: Still In Hospital | Payer: Self-pay

## 2022-11-10 DIAGNOSIS — J0100 Acute maxillary sinusitis, unspecified: Secondary | ICD-10-CM

## 2022-11-10 MED ORDER — AMOXICILLIN-POT CLAVULANATE 875-125 MG PO TABS: 1.0000 | ORAL_TABLET | Freq: Two times a day (BID) | ORAL | 0 refills | Status: DC

## 2022-11-10 NOTE — ED Triage Notes (Signed)
Pt presents to the office for cough and nasal congestion x 2 weeks. Pt reports taken covid/flu test with negative results.

## 2022-11-10 NOTE — Discharge Instructions (Signed)
You were diagnosed with a sinus infection.  Take augmentin twice/day x 10 days.  You may continue over the counter medications as well if they are helpful.  Please return if not improving or worsening.

## 2022-11-10 NOTE — ED Provider Notes (Signed)
[facility]    CSN: [id number] Arrival date & time: 11/10/22  1304      History   Chief Complaint Chief Complaint  Patient presents with   Nasal Congestion   Cough    HPI Susan Johnson is a 36 y.o. female.   Patient is here for uri symptoms.  Patient has had 2 weeks of cough, chest congestion, sinus congestion, drainage.  Woke up this morning with bad sore throat.  + sinus pain/pressure, also with teeth pain.  No fevers/chills.  Mild sob She has taken a lot of otc meds.  She did a covid/flu test both were negative.       Past Medical History:  Diagnosis Date   Thyroid disease     Patient Active Problem List   Diagnosis Date Noted   IUD (intrauterine device) in place 07/24/2017   Fibroids, intramural 07/24/2017   Obesity (BMI 30.0-34.9) 07/15/2017   Other postablative hypothyroidism 07/12/2013   Toxic diffuse goiter 08/03/2009    Past Surgical History:  Procedure Laterality Date   kyleena iud     inserted 06-12-17, removed & re-inserted 07-06-21    OB History     Gravida  0   Para  0   Term  0   Preterm  0   AB  0   Living  0      SAB  0   IAB  0   Ectopic  0   Multiple  0   Live Births  0            Home Medications    Prior to Admission medications   Medication Sig Start Date End Date Taking? Authorizing Provider  fluconazole (DIFLUCAN) 150 MG tablet Take 1 tablet (150 mg total) by mouth every three (3) days as needed. 06/16/22   Sharion Balloon, FNP  Levonorgestrel (KYLEENA IU) by Intrauterine route. Placed 06/12/17    [provider]  levothyroxine (SYNTHROID) 200 MCG tablet Take 200 mcg by mouth daily. 12/26/21   [provider]  phentermine 15 MG capsule Take 15 mg by mouth every morning.    [provider]    Family History Family History  Problem Relation Age of Onset   Thyroid disease Mother    Thyroid disease Sister    Down syndrome Sister    Diabetes Paternal Aunt     Diabetes Paternal Uncle    Lung cancer Maternal Grandfather    Prostate cancer Paternal Grandfather     Social History Social History   Tobacco Use   Smoking status: Never   Smokeless tobacco: Never  Vaping Use   Vaping Use: Never used  Substance Use Topics   Alcohol use: Yes    Alcohol/week: 3.0 standard drinks of alcohol    Types: 3 Glasses of wine per week   Drug use: Never     Allergies   Patient has no known allergies.   Review of Systems Review of Systems  Constitutional:  Negative for chills and fever.  HENT:  Positive for congestion, rhinorrhea, sinus pressure and sore throat.   Respiratory:  Positive for cough.   Gastrointestinal: Negative.   Musculoskeletal: Negative.   Psychiatric/Behavioral: Negative.       Physical Exam Triage Vital Signs ED Triage Vitals [11/10/22 1408]  Enc Vitals Group     BP 134/84     Pulse Rate 78     Resp 18     Temp 97.8 F (36.6 C)  Temp Source Oral     SpO2      Weight      Height      Head Circumference      Peak Flow      Pain Score      Pain Loc      Pain Edu?      Excl. in [place]?    No data found.  Updated Vital Signs BP 134/84 (BP Location: Left Arm)   Pulse 78   Temp 97.8 F (36.6 C) (Oral)   Resp 18   Visual Acuity Right Eye Distance:   Left Eye Distance:   Bilateral Distance:    Right Eye Near:   Left Eye Near:    Bilateral Near:     Physical Exam Constitutional:      Appearance: Normal appearance.  HENT:     Right Ear: A middle ear effusion is present.     Left Ear: A middle ear effusion is present.     Nose:     Right Sinus: Maxillary sinus tenderness present.     Left Sinus: Maxillary sinus tenderness present.     Mouth/Throat:     Pharynx: Posterior oropharyngeal erythema present. No oropharyngeal exudate.     Tonsils: No tonsillar exudate or tonsillar abscesses.  Cardiovascular:     Rate and Rhythm: Normal rate and regular rhythm.  Pulmonary:     Effort: Pulmonary effort is  normal.     Breath sounds: Normal breath sounds.  Musculoskeletal:     Cervical back: Normal range of motion and neck supple. No tenderness.  Lymphadenopathy:     Cervical: No cervical adenopathy.  Skin:    General: Skin is warm.  Neurological:     General: No focal deficit present.     Mental Status: She is alert.  Psychiatric:        Mood and Affect: Mood normal.      UC Treatments / Results  Labs (all labs ordered are listed, but only abnormal results are displayed) Labs Reviewed - No data to display  EKG   Radiology No results found.  Procedures Procedures (including critical care time)  Medications Ordered in UC Medications - No data to display  Initial Impression / Assessment and Plan / UC Course  I have reviewed the triage vital signs and the nursing notes.  Pertinent labs & imaging results that were available during my care of the patient were reviewed by me and considered in my medical decision making (see chart for details).   Final Clinical Impressions(s) / UC Diagnoses   Final diagnoses:  Acute non-recurrent maxillary sinusitis     Discharge Instructions      You were diagnosed with a sinus infection.  Take augmentin twice/day x 10 days.  You may continue over the counter medications as well if they are helpful.  Please return if not improving or worsening.     ED Prescriptions     Medication Sig Dispense Auth. Provider   amoxicillin-clavulanate (AUGMENTIN) 875-125 MG tablet Take 1 tablet by mouth every 12 (twelve) hours. 20 tablet Rondel Oh, MD      PDMP not reviewed this encounter.   Rondel Oh, MD 11/10/22 1430

## 2022-12-12 ENCOUNTER — Telehealth: Payer: Self-pay

## 2022-12-12 NOTE — Telephone Encounter (Signed)
Patient has her first HPV vaccine 03/14/22 and did not return in the 1-2 month window. She questions can she have her second vaccine when she comes 01/20/23 for her AEX. Advised, per clinic CMA, that she can have the 2nd vaccine at her AEX and importance stressed to finish the vaccine schedule after that.

## 2023-01-06 NOTE — Progress Notes (Signed)
37 y.o. G0P0000 Single African American female here for annual exam.  Pt ran a UA at her office last Wed and noticed trace amounts of blood.  She tested her urine due to darkness of her urine.  Drinks a lot of coffee.   No dysuria.  No history of renal stones.  Was not on her period at the time.   Regular menses with Verdia Kuba IUD. Cramping is less with IUD.  Heavy bleeding for first couple of days, then spotting occurs.   Wants STD screening.   Due for a Gardasil vaccination.   PCP:   [facility].  Patient's last menstrual period was 12/18/2022.     Period Cycle (Days): 28 Period Duration (Days): 4-5 Period Pattern: Regular Menstrual Flow: Heavy Menstrual Control: Tampon, Other (Comment) Dysmenorrhea: (!) Moderate     Sexually active: Yes.    The current method of family planning is IUD--Kyleena 07/06/21.    Exercising: Yes.     Weight lifting and cardio Smoker:  no  Health Maintenance: Pap:  01/04/21 neg: HR HPV neg, 02/26/17 neg History of abnormal Pap:  no MMG: 02-23-21 (care everywhere) bilateral & left breast u/s neg  Colonoscopy:  n/a BMD:   n/a  Result  n/a TDaP:  11/22/20 Gardasil:   yes, not fully completed HIV: 01/04/21 NR Hep C: 01/04/21 Neg Screening Labs:  Endocrinology   reports that she has never smoked. She has never used smokeless tobacco. She reports current alcohol use of about 3.0 standard drinks of alcohol per week. She reports that she does not use drugs.  Past Medical History:  Diagnosis Date   Thyroid disease     Past Surgical History:  Procedure Laterality Date   kyleena iud     inserted 06-12-17, removed & re-inserted 07-06-21    Current Outpatient Medications  Medication Sig Dispense Refill   Levonorgestrel (KYLEENA IU) by Intrauterine route. Placed 06/12/17     levothyroxine (SYNTHROID) 200 MCG tablet Take 200 mcg by mouth daily.     ondansetron (ZOFRAN-ODT) 4 MG disintegrating tablet Take by mouth.     Semaglutide-Weight Management  2.4 MG/0.75ML SOAJ Inject into the skin.     No current facility-administered medications for this visit.    Family History  Problem Relation Age of Onset   Thyroid disease Mother    Thyroid disease Sister    Down syndrome Sister    Diabetes Paternal Aunt    Diabetes Paternal Uncle    Lung cancer Maternal Grandfather    Prostate cancer Paternal Grandfather     Review of Systems  Genitourinary:  Positive for hematuria.  All other systems reviewed and are negative.   Exam:   BP 118/76 (BP Location: Right Arm, Patient Position: Sitting, Cuff Size: Large)   Pulse 100   Ht 5' 7"$  (1.702 m)   Wt 210 lb (95.3 kg)   LMP 12/18/2022   SpO2 98%   BMI 32.89 kg/m     General appearance: alert, cooperative and appears stated age Head: normocephalic, without obvious abnormality, atraumatic Neck: no adenopathy, supple, symmetrical, trachea midline and thyroid normal to inspection and palpation Lungs: clear to auscultation bilaterally Breasts: normal appearance, no masses or tenderness, No nipple retraction or dimpling, No nipple discharge or bleeding, No axillary adenopathy Heart: regular rate and rhythm Abdomen: soft, non-tender; no masses, no organomegaly Extremities: extremities normal, atraumatic, no cyanosis or edema Skin: skin color, texture, turgor normal. No rashes or lesions Lymph nodes: cervical, supraclavicular, and axillary nodes normal. Neurologic: grossly  normal  Pelvic: External genitalia:  no lesions              No abnormal inguinal nodes palpated.              Urethra:  normal appearing urethra with no masses, tenderness or lesions              Bartholins and Skenes: normal                 Vagina: normal appearing vagina with normal color and discharge, no lesions              Cervix: no lesions.  IUD strings noted.              Pap taken: no Bimanual Exam:  Uterus:  normal size, contour, position, consistency, mobility, non-tender              Adnexa: no mass,  fullness, tenderness   Chaperone was present for exam:  Raquel Sarna  Assessment:   Well woman visit with gynecologic exam. Kyleena IUD.  Due for removal in 2027.  Doing Gardasil series.  STD screening.  Microscopic hematuria.    Plan: Mammogram screening discussed. Self breast awareness reviewed. Pap and HR HPV 2027. Guidelines for Calcium, Vitamin D, regular exercise program including cardiovascular and weight bearing exercise. Gardasil #3 today.  STD screening.   Urinalysis:  sg 1.030, pH 5.5, 2+ blood, 1+ bilirubin, 0 - 5 WBC, 3 - 10 RBC, 6 - 10 squams, many mucus, few bacteria. NS crystals, NS casts. UC sent.  Hydration encouraged.  Follow up annually and prn.   After visit summary provided.

## 2023-01-15 ENCOUNTER — Ambulatory Visit: Payer: 59 | Admitting: Obstetrics and Gynecology

## 2023-01-20 ENCOUNTER — Ambulatory Visit (INDEPENDENT_AMBULATORY_CARE_PROVIDER_SITE_OTHER): Payer: 59 | Admitting: Obstetrics and Gynecology

## 2023-01-20 ENCOUNTER — Encounter: Payer: Self-pay | Admitting: Obstetrics and Gynecology

## 2023-01-20 ENCOUNTER — Other Ambulatory Visit (HOSPITAL_COMMUNITY)
Admission: RE | Admit: 2023-01-20 | Discharge: 2023-01-20 | Disposition: A | Discharge status: Home / Self Care | Payer: 59 | Source: Ambulatory Visit | Attending: Obstetrics and Gynecology | Admitting: Obstetrics and Gynecology

## 2023-01-20 VITALS — BP 118/76 | HR 100 | Ht 67.0 in | Wt 210.0 lb

## 2023-01-20 DIAGNOSIS — Z01419 Encounter for gynecological examination (general) (routine) without abnormal findings: Secondary | ICD-10-CM | POA: Diagnosis not present

## 2023-01-20 DIAGNOSIS — Z23 Encounter for immunization: Secondary | ICD-10-CM | POA: Diagnosis not present

## 2023-01-20 DIAGNOSIS — R319 Hematuria, unspecified: Secondary | ICD-10-CM | POA: Diagnosis not present

## 2023-01-20 DIAGNOSIS — Z113 Encounter for screening for infections with a predominantly sexual mode of transmission: Secondary | ICD-10-CM

## 2023-01-20 DIAGNOSIS — Z114 Encounter for screening for human immunodeficiency virus [HIV]: Secondary | ICD-10-CM

## 2023-01-20 DIAGNOSIS — Z1159 Encounter for screening for other viral diseases: Secondary | ICD-10-CM

## 2023-01-20 NOTE — Patient Instructions (Signed)

## 2023-01-21 LAB — URINALYSIS, COMPLETE W/RFL CULTURE
Glucose, UA: NEGATIVE
Hyaline Cast: NONE SEEN /LPF
Leukocyte Esterase: NEGATIVE
Nitrites, Initial: NEGATIVE
Specific Gravity, Urine: 1.03 (ref 1.001–1.035)
pH: 5.5 (ref 5.0–8.0)

## 2023-01-21 LAB — URINE CULTURE
MICRO NUMBER:: 14582886
Result:: NO GROWTH
SPECIMEN QUALITY:: ADEQUATE

## 2023-01-21 LAB — HEPATITIS C ANTIBODY: Hepatitis C Ab: NONREACTIVE

## 2023-01-21 LAB — RPR: RPR Ser Ql: NONREACTIVE

## 2023-01-21 LAB — CERVICOVAGINAL ANCILLARY ONLY
Chlamydia: NEGATIVE
Comment: NEGATIVE
Comment: NEGATIVE
Comment: NORMAL
Neisseria Gonorrhea: NEGATIVE
Trichomonas: NEGATIVE

## 2023-01-21 LAB — CULTURE INDICATED

## 2023-01-21 LAB — HIV ANTIBODY (ROUTINE TESTING W REFLEX): HIV 1&2 Ab, 4th Generation: NONREACTIVE

## 2023-01-24 NOTE — Progress Notes (Unsigned)
GYNECOLOGY  VISIT   HPI: 37 y.o.   Single  African American  female   [place] with Patient's last menstrual period was 01/29/2023.   here for catheter urinalysis.  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  She had a urinalysis 01/20/23 here that showed 1+ bilirubin, 2+ hemoglobin, 1+ protein, 3 - 10 RBC, 6 - 10 squmas, few bacteria, mucus present. UC was negative.  Started her period today, very light.   GYNECOLOGIC HISTORY: Patient's last menstrual period was 01/29/2023. Contraception:  IUD--Kyleena 07/06/21.     Menopausal hormone therapy:  n/a Last mammogram:  02-23-21 (care everywhere) bilateral & left breast u/s neg   Last pap smear:   01/04/21 neg: HR HPV neg, 02/26/17 neg         OB History     Gravida  0   Para  0   Term  0   Preterm  0   AB  0   Living  0      SAB  0   IAB  0   Ectopic  0   Multiple  0   Live Births  0              Patient Active Problem List   Diagnosis Date Noted   IUD (intrauterine device) in place 07/24/2017   Fibroids, intramural 07/24/2017   Obesity (BMI 30.0-34.9) 07/15/2017   Other postablative hypothyroidism 07/12/2013   Toxic diffuse goiter 08/03/2009    Past Medical History:  Diagnosis Date   Thyroid disease     Past Surgical History:  Procedure Laterality Date   kyleena iud     inserted 06-12-17, removed & re-inserted 07-06-21    Current Outpatient Medications  Medication Sig Dispense Refill   Levonorgestrel (KYLEENA IU) by Intrauterine route. Placed 06/12/17     levothyroxine (SYNTHROID) 200 MCG tablet Take 200 mcg by mouth daily.     ondansetron (ZOFRAN-ODT) 4 MG disintegrating tablet Take by mouth.     Semaglutide-Weight Management 2.4 MG/0.75ML SOAJ Inject into the skin.     No current facility-administered medications for this visit.     ALLERGIES: Patient has no known allergies.  Family History  Problem Relation Age of Onset   Thyroid disease Mother     Thyroid disease Sister    Down syndrome Sister    Diabetes Paternal Aunt    Diabetes Paternal Uncle    Lung cancer Maternal Grandfather    Prostate cancer Paternal Grandfather     Social History   Socioeconomic History   Marital status: Single    Spouse name: Not on file   Number of children: Not on file   Years of education: Not on file   Highest education level: Not on file  Occupational History   Not on file  Tobacco Use   Smoking status: Never   Smokeless tobacco: Never  Vaping Use   Vaping Use: Never used  Substance and Sexual Activity   Alcohol use: Yes    Alcohol/week: 3.0 standard drinks of alcohol    Types: 3 Glasses of wine per week   Drug use: Never   Sexual activity: Yes    Birth control/protection: I.U.D.    Comment: Kyleena placed 07-06-21  Other Topics Concern   Not on file  Social History Narrative   Not on file   Social Determinants of Health   Financial Resource Strain: Not on file  Food Insecurity: Not on file  Transportation Needs: Not on file  Physical Activity: Not on file  Stress: Not on file  Social Connections: Not on file  Intimate Partner Violence: Not on file    Review of Systems  All other systems reviewed and are negative.   PHYSICAL EXAMINATION:    BP 124/76 (BP Location: Right Arm, Patient Position: Sitting, Cuff Size: Large)   Pulse 86   Ht '5\' 7"'$  (1.702 m)   Wt 210 lb (95.3 kg)   LMP 01/29/2023   SpO2 97%   BMI 32.89 kg/m     General appearance: alert, cooperative and appears stated age  Pelvic: External genitalia:  erythema around the urethra.               Urethra:  normal appearing urethra with no masses, tenderness or lesions  Catheter specimen urine collection.  Verbal consent obtained.  Cleansing with betadine.  Catheter passed without difficulty. 17 cc clear urine collected and sent to lab.  No complications.   Chaperone was present for exam:  Raquel Sarna  ASSESSMENT  Microscopic  hematuria.  PLAN  Urinalysis and reflex culture.  If hematuria is present, will refer to urology.    An After Visit Summary was printed and given to the patient.  10 min  total time was spent for this patient encounter, including preparation, face-to-face counseling with the patient, coordination of care, and documentation of the encounter in addition to doing catherization.

## 2023-01-29 ENCOUNTER — Encounter: Payer: Self-pay | Admitting: Obstetrics and Gynecology

## 2023-01-29 ENCOUNTER — Ambulatory Visit: Payer: 59 | Admitting: Obstetrics and Gynecology

## 2023-01-29 VITALS — BP 124/76 | HR 86 | Ht 67.0 in | Wt 210.0 lb

## 2023-01-29 DIAGNOSIS — R3129 Other microscopic hematuria: Secondary | ICD-10-CM | POA: Diagnosis not present

## 2023-01-31 ENCOUNTER — Telehealth: Payer: Self-pay | Admitting: *Deleted

## 2023-01-31 DIAGNOSIS — R319 Hematuria, unspecified: Secondary | ICD-10-CM

## 2023-01-31 NOTE — Telephone Encounter (Signed)
Patient was seen for a catheter specimen urinalysis and reflex culture due to microscopic hematuria.  The primary concern is microscopic hematuria.  The specimen was sent out to [facility] for these to be processed by the lab.    Please clarify the status of this order.

## 2023-01-31 NOTE — Telephone Encounter (Signed)
[facility] rejected Cath UA due to wrong specimen type. Urine culture will be ran.   Routing to Provider for recommendations. Does patient need to come back in for repeat Cath UA?   Lab will have correct process for sending this off if Provider recommends patient coming back in.

## 2023-02-01 LAB — URINE CULTURE
MICRO NUMBER:: 14637772
Result:: NO GROWTH
SPECIMEN QUALITY:: ADEQUATE

## 2023-02-01 LAB — TEST AUTHORIZATION

## 2023-02-01 LAB — URINALYSIS, COMPLETE W/RFL CULTURE

## 2023-02-01 LAB — NO CULTURE INDICATED

## 2023-02-01 LAB — EXTRA URINE SPECIMEN

## 2023-02-03 NOTE — Telephone Encounter (Signed)
Per review of EPIC, urine culture resulted 02/01/23.

## 2023-02-04 NOTE — Telephone Encounter (Signed)
Thank you for updating the patient.

## 2023-02-04 NOTE — Telephone Encounter (Signed)
Reviewed with Dr. Quincy Simmonds.  Call returned to patient. Advised urine culture negative for infection. Lab was unable to process UA. Still need UA to determine if hematuria present. Return for clean catch UA.   LMP 01/27/23, still spotting. Patient agreeable to return to [facility] for clean catch UA. Patient will review schedule with employer and return call to schedule LAB appt. Future lab appt placed. Patient verbalizes understanding.

## 2023-02-17 NOTE — Telephone Encounter (Signed)
Left message to call Haidyn Kilburg, RN at [facility], [phone].  

## 2023-02-19 NOTE — Telephone Encounter (Signed)
Routing to Dr. Antony Blackbird.   Lab appt scheduled for 3/25 at 0830.

## 2023-02-19 NOTE — Telephone Encounter (Signed)
Left message to call Sharee Pimple, RN at [facility], [phone], opt 5. Advised may also choose OPT 1 for appts to schedule lab appt.

## 2023-02-24 ENCOUNTER — Other Ambulatory Visit: Payer: 59

## 2023-02-24 DIAGNOSIS — R319 Hematuria, unspecified: Secondary | ICD-10-CM

## 2023-02-24 LAB — URINALYSIS, COMPLETE
Bilirubin Urine: NEGATIVE
Glucose, UA: NEGATIVE
Hyaline Cast: NONE SEEN /LPF
Ketones, ur: NEGATIVE
Leukocytes,Ua: NEGATIVE
Nitrite: NEGATIVE
Protein, ur: NEGATIVE
Specific Gravity, Urine: 1.027 (ref 1.001–1.035)
WBC, UA: NONE SEEN /HPF (ref 0–5)
pH: 5.5 (ref 5.0–8.0)

## 2023-08-11 ENCOUNTER — Ambulatory Visit (INDEPENDENT_AMBULATORY_CARE_PROVIDER_SITE_OTHER): Payer: Managed Care, Other (non HMO) | Admitting: Family Medicine

## 2023-08-11 VITALS — BP 115/78 | HR 82 | Temp 98.6°F | Ht 67.0 in | Wt 216.0 lb

## 2023-08-11 DIAGNOSIS — E890 Postprocedural hypothyroidism: Secondary | ICD-10-CM | POA: Insufficient documentation

## 2023-08-11 DIAGNOSIS — Z0001 Encounter for general adult medical examination with abnormal findings: Secondary | ICD-10-CM | POA: Diagnosis not present

## 2023-08-11 DIAGNOSIS — Z114 Encounter for screening for human immunodeficiency virus [HIV]: Secondary | ICD-10-CM

## 2023-08-11 DIAGNOSIS — Z0000 Encounter for general adult medical examination without abnormal findings: Secondary | ICD-10-CM | POA: Insufficient documentation

## 2023-08-11 DIAGNOSIS — E669 Obesity, unspecified: Secondary | ICD-10-CM | POA: Diagnosis not present

## 2023-08-11 DIAGNOSIS — Z1159 Encounter for screening for other viral diseases: Secondary | ICD-10-CM

## 2023-08-11 DIAGNOSIS — Z113 Encounter for screening for infections with a predominantly sexual mode of transmission: Secondary | ICD-10-CM

## 2023-08-11 NOTE — Patient Instructions (Signed)
It was great to meet you today and I'm excited to have you join the [facility] practice. I hope you had a positive experience today! If you feel so inclined, please feel free to recommend our practice to friends and family. Mila Merry, FNP-C

## 2023-08-11 NOTE — Assessment & Plan Note (Signed)
Continue Synthroid daily. Keep your appointments with Endo, next in 3 months.

## 2023-08-11 NOTE — Progress Notes (Signed)
New Patient Office Visit  Subjective    Patient ID: Susan Johnson, female    DOB: 12/29/1985  Age: 37 y.o. MRN: [id number]  CC:  Chief Complaint  Patient presents with   Establish Care    HPI [name] presents to establish care. Oriented to practice routines. She has not  been seeing a PCP regularly. She does have a PMH of postablative hypothyroidism and she is followed by Endocrinology. She is also established with an OBGYN for women's health. No concerns today.  Cervical CA screening: approximate date 2023 and was normal Tobacco: non-smoker STI: consents Vaccines: UTD    Outpatient Encounter Medications as of 08/11/2023  Medication Sig   Levonorgestrel (KYLEENA IU) by Intrauterine route. Placed 06/12/17   levothyroxine (SYNTHROID) 175 MCG tablet Take 175 mcg by mouth daily.   phentermine (ADIPEX-P) 37.5 MG tablet Take 1 tablet by mouth daily with breakfast.   [DISCONTINUED] levothyroxine (SYNTHROID) 200 MCG tablet Take 200 mcg by mouth daily.   [DISCONTINUED] ondansetron (ZOFRAN-ODT) 4 MG disintegrating tablet Take by mouth.   [DISCONTINUED] Semaglutide-Weight Management 2.4 MG/0.75ML SOAJ Inject into the skin.   No facility-administered encounter medications on file as of 08/11/2023.    Past Medical History:  Diagnosis Date   Thyroid disease     Past Surgical History:  Procedure Laterality Date   kyleena iud     inserted 06-12-17, removed & re-inserted 07-06-21    Family History  Problem Relation Age of Onset   Thyroid disease Mother    Thyroid disease Sister    Down syndrome Sister    Diabetes Paternal Aunt    Diabetes Paternal Uncle    Lung cancer Maternal Grandfather    Prostate cancer Paternal Grandfather     Social History   Socioeconomic History   Marital status: Single    Spouse name: Not on file   Number of children: Not on file   Years of education: Not on file   Highest education level: Bachelor's degree (e.g., BA, AB, BS)  Occupational  History   Not on file  Tobacco Use   Smoking status: Never   Smokeless tobacco: Never  Vaping Use   Vaping status: Never Used  Substance and Sexual Activity   Alcohol use: Yes    Alcohol/week: 3.0 standard drinks of alcohol    Types: 3 Glasses of wine per week   Drug use: Never   Sexual activity: Yes    Birth control/protection: I.U.D.    Comment: Kyleena placed 07-06-21  Other Topics Concern   Not on file  Social History Narrative   Not on file   Social Determinants of Health   Financial Resource Strain: Low Risk  (08/11/2023)   Overall Financial Resource Strain (CARDIA)    Difficulty of Paying Living Expenses: Not hard at all  Food Insecurity: No Food Insecurity (08/11/2023)   Hunger Vital Sign    Worried About Running Out of Food in the Last Year: Never true    Ran Out of Food in the Last Year: Never true  Transportation Needs: No Transportation Needs (08/11/2023)   PRAPARE - Administrator, Civil Service (Medical): No    Lack of Transportation (Non-Medical): No  Physical Activity: Insufficiently Active (08/11/2023)   Exercise Vital Sign    Days of Exercise per Week: 3 days    Minutes of Exercise per Session: 40 min  Stress: No Stress Concern Present (08/11/2023)   [employer] of Occupational Health - Occupational Stress Questionnaire  Feeling of Stress : Not at all  Social Connections: Socially Integrated (08/11/2023)   Social Connection and Isolation Panel [NHANES]    Frequency of Communication with Friends and Family: More than three times a week    Frequency of Social Gatherings with Friends and Family: Once a week    Attends Religious Services: 1 to 4 times per year    Active Member of [employer] or Organizations: Yes    Attends Banker Meetings: 1 to 4 times per year    Marital Status: Living with partner  Intimate Partner Violence: Not At Risk (03/25/2023)   Received from [facility]   HITS    Over the last 12 months how often  did your partner physically hurt you?: 1    Over the last 12 months how often did your partner insult you or talk down to you?: 1    Over the last 12 months how often did your partner threaten you with physical harm?: 1    Over the last 12 months how often did your partner scream or curse at you?: 1    Review of Systems  Constitutional: Negative.   HENT: Negative.    Eyes: Negative.   Respiratory: Negative.    Cardiovascular: Negative.   Gastrointestinal: Negative.   Genitourinary: Negative.   Musculoskeletal: Negative.   Skin: Negative.   Neurological: Negative.   Endo/Heme/Allergies: Negative.   Psychiatric/Behavioral: Negative.    All other systems reviewed and are negative.       Objective    BP 115/78   Pulse 82   Temp 98.6 F (37 C) (Oral)   Ht 5\' 7"  (1.702 m)   Wt 216 lb (98 kg)   LMP  (Approximate)   SpO2 95%   BMI 33.83 kg/m   Physical Exam Vitals and nursing note reviewed.  Constitutional:      Appearance: Normal appearance. She is normal weight.  HENT:     Head: Normocephalic and atraumatic.     Right Ear: Tympanic membrane, ear canal and external ear normal.     Left Ear: Tympanic membrane, ear canal and external ear normal.     Nose: Nose normal.     Mouth/Throat:     Mouth: Mucous membranes are moist.     Pharynx: Oropharynx is clear.  Eyes:     Extraocular Movements: Extraocular movements intact.     Conjunctiva/sclera: Conjunctivae normal.     Pupils: Pupils are equal, round, and reactive to light.  Cardiovascular:     Rate and Rhythm: Normal rate and regular rhythm.     Pulses: Normal pulses.     Heart sounds: Normal heart sounds.  Pulmonary:     Effort: Pulmonary effort is normal.     Breath sounds: Normal breath sounds.  Abdominal:     General: Bowel sounds are normal.     Palpations: Abdomen is soft.  Musculoskeletal:        General: Normal range of motion.     Cervical back: Normal range of motion and neck supple.  Skin:     General: Skin is warm and dry.     Capillary Refill: Capillary refill takes less than 2 seconds.  Neurological:     General: No focal deficit present.     Mental Status: She is alert and oriented to person, place, and time. Mental status is at baseline.  Psychiatric:        Mood and Affect: Mood normal.  Behavior: Behavior normal.        Thought Content: Thought content normal.        Judgment: Judgment normal.         Assessment & Plan:   Problem List Items Addressed This Visit     Obesity (BMI 30.0-34.9)    Counseled on importance of weight management for overall health. Encouraged low calorie, heart healthy diet and moderate intensity exercise 150 minutes weekly. This is 3-5 times weekly for 30-50 minutes each session. Goal should be pace of 3 miles/hours, or walking 1.5 miles in 30 minutes and include strength training. Discussed risks of obesity.      Physical exam, annual - Primary    Today your medical history was reviewed and routine physical exam with labs was performed. Recommend 150 minutes of moderate intensity exercise weekly and consuming a well-balanced diet. Advised to stop smoking if a smoker, avoid smoking if a non-smoker, limit alcohol consumption to 1 drink per day for women and 2 drinks per day for men, and avoid illicit drug use. Counseled on safe sex practices and offered STI testing today. Counseled on the importance of sunscreen use. Counseled in mental health awareness and when to seek medical care. Vaccine maintenance discussed. Appropriate health maintenance items reviewed. Return to office in 1 year for annual physical exam.       Relevant Orders   CBC with Differential/Platelet   COMPLETE METABOLIC PANEL WITH GFR   Lipid panel   Postablative hypothyroidism    Continue Synthroid daily. Keep your appointments with Endo, next in 3 months.      Relevant Medications   levothyroxine (SYNTHROID) 175 MCG tablet   Other Visit Diagnoses      Screening for HIV (human immunodeficiency virus)       Relevant Orders   HIV Antibody (routine testing w rflx)   Need for hepatitis C screening test       Relevant Orders   Hepatitis C antibody   Routine screening for STI (sexually transmitted infection)       Relevant Orders   RPR       Return in about 1 year (around 08/10/2024) for annual physical with labs 1 week prior.   Park Meo, FNP

## 2023-08-11 NOTE — Assessment & Plan Note (Signed)

## 2023-08-11 NOTE — Assessment & Plan Note (Signed)
 Counseled on importance of weight management for overall health. Encouraged low calorie, heart healthy diet and moderate intensity exercise 150 minutes weekly. This is 3-5 times weekly for 30-50 minutes each session. Goal should be pace of 3 miles/hours, or walking 1.5 miles in 30 minutes and include strength training. Discussed risks of obesity.

## 2023-08-13 LAB — CBC WITH DIFFERENTIAL/PLATELET
Absolute Monocytes: 347 {cells}/uL (ref 200–950)
Basophils Absolute: 38 {cells}/uL (ref 0–200)
Basophils Relative: 0.6 %
Eosinophils Absolute: 340 {cells}/uL (ref 15–500)
Eosinophils Relative: 5.4 %
HCT: 41.4 % (ref 35.0–45.0)
Hemoglobin: 13.6 g/dL (ref 11.7–15.5)
Lymphs Abs: 2161 {cells}/uL (ref 850–3900)
MCH: 28.6 pg (ref 27.0–33.0)
MCHC: 32.9 g/dL (ref 32.0–36.0)
MCV: 87.2 fL (ref 80.0–100.0)
MPV: 9.2 fL (ref 7.5–12.5)
Monocytes Relative: 5.5 %
Neutro Abs: 3415 {cells}/uL (ref 1500–7800)
Neutrophils Relative %: 54.2 %
Platelets: 303 10*3/uL (ref 140–400)
RBC: 4.75 10*6/uL (ref 3.80–5.10)
RDW: 13 % (ref 11.0–15.0)
Total Lymphocyte: 34.3 %
WBC: 6.3 10*3/uL (ref 3.8–10.8)

## 2023-08-13 LAB — COMPLETE METABOLIC PANEL WITH GFR
AG Ratio: 1.3 (calc) (ref 1.0–2.5)
ALT: 28 U/L (ref 6–29)
AST: 21 U/L (ref 10–30)
Albumin: 4.1 g/dL (ref 3.6–5.1)
Alkaline phosphatase (APISO): 36 U/L (ref 31–125)
BUN: 15 mg/dL (ref 7–25)
CO2: 23 mmol/L (ref 20–32)
Calcium: 9.6 mg/dL (ref 8.6–10.2)
Chloride: 107 mmol/L (ref 98–110)
Creat: 0.93 mg/dL (ref 0.50–0.97)
Globulin: 3.1 g/dL (ref 1.9–3.7)
Glucose, Bld: 93 mg/dL (ref 65–99)
Potassium: 4.6 mmol/L (ref 3.5–5.3)
Sodium: 138 mmol/L (ref 135–146)
Total Bilirubin: 0.4 mg/dL (ref 0.2–1.2)
Total Protein: 7.2 g/dL (ref 6.1–8.1)
eGFR: 82 mL/min/{1.73_m2} (ref >=60)

## 2023-08-13 LAB — LIPID PANEL
Cholesterol: 194 mg/dL (ref <200)
HDL: 66 mg/dL (ref >=50)
LDL Cholesterol (Calc): 113 mg/dL — ABNORMAL HIGH
Non-HDL Cholesterol (Calc): 128 mg/dL (ref <130)
Total CHOL/HDL Ratio: 2.9 (calc) (ref <5.0)
Triglycerides: 60 mg/dL (ref <150)

## 2023-08-13 LAB — RPR: RPR Ser Ql: NONREACTIVE

## 2023-08-13 LAB — HIV ANTIBODY (ROUTINE TESTING W REFLEX): HIV 1&2 Ab, 4th Generation: NONREACTIVE

## 2023-08-13 LAB — HEPATITIS C ANTIBODY: Hepatitis C Ab: NONREACTIVE

## 2023-08-18 ENCOUNTER — Telehealth: Payer: Self-pay

## 2023-08-18 NOTE — Telephone Encounter (Signed)
LVM regarding lab results

## 2023-09-19 ENCOUNTER — Ambulatory Visit: Payer: Managed Care, Other (non HMO)

## 2023-09-19 ENCOUNTER — Other Ambulatory Visit (INDEPENDENT_AMBULATORY_CARE_PROVIDER_SITE_OTHER): Payer: Managed Care, Other (non HMO)

## 2023-09-19 DIAGNOSIS — Z23 Encounter for immunization: Secondary | ICD-10-CM | POA: Diagnosis not present

## 2023-09-19 NOTE — Progress Notes (Signed)
Pt came in for flu vaccine. Injection was given on the L-upper arm, pt tol injection well w/no c/o. Pt left ambulatory w/no c/o.

## 2023-09-19 NOTE — Progress Notes (Signed)
Came in today to flu vaccine. Inj given on the left upper arm, pt tol injection well w/no c/o. Pt left ambulatory w/no c/o.

## 2023-09-29 ENCOUNTER — Ambulatory Visit (HOSPITAL_COMMUNITY)
Admission: RE | Admit: 2023-09-29 | Discharge: 2023-09-29 | Disposition: A | Discharge status: Home / Self Care | Payer: Managed Care, Other (non HMO) | Source: Ambulatory Visit | Attending: Internal Medicine | Admitting: Internal Medicine

## 2023-09-29 ENCOUNTER — Encounter (HOSPITAL_COMMUNITY): Payer: Self-pay

## 2023-09-29 ENCOUNTER — Ambulatory Visit (INDEPENDENT_AMBULATORY_CARE_PROVIDER_SITE_OTHER): Payer: Managed Care, Other (non HMO)

## 2023-09-29 VITALS — BP 127/80 | HR 90 | Temp 98.8°F | Resp 16 | Ht 68.0 in | Wt 211.0 lb

## 2023-09-29 DIAGNOSIS — J189 Pneumonia, unspecified organism: Secondary | ICD-10-CM

## 2023-09-29 MED ORDER — PROMETHAZINE-DM 6.25-15 MG/5ML PO SYRP: 5.0000 mL | ORAL_SOLUTION | Freq: Four times a day (QID) | ORAL | 0 refills | Status: AC | PRN

## 2023-09-29 MED ORDER — AMOXICILLIN 500 MG PO CAPS: 1000.0000 mg | ORAL_CAPSULE | Freq: Two times a day (BID) | ORAL | 0 refills | Status: AC

## 2023-09-29 MED ORDER — ALBUTEROL SULFATE HFA 108 (90 BASE) MCG/ACT IN AERS: 2.0000 | INHALATION_SPRAY | Freq: Four times a day (QID) | RESPIRATORY_TRACT | 0 refills | Status: DC | PRN

## 2023-09-29 MED ORDER — AZITHROMYCIN 250 MG PO TABS: 250.0000 mg | ORAL_TABLET | Freq: Every day | ORAL | 0 refills | Status: DC

## 2023-09-29 NOTE — ED Provider Notes (Signed)
[facility]    CSN: [id number] Arrival date & time: 09/29/23  1513      History   Chief Complaint Chief Complaint  Patient presents with   Cough    Cough and nasal congestion x2 weeks - Entered by patient    HPI Gwen Wogoman is a 37 y.o. female.    Cough Associated symptoms: chest pain, chills, fever, rhinorrhea, shortness of breath and sore throat   Associated symptoms: no ear pain, no rash and no wheezing   Sick for more than 10 days, include cough with occasional sputum nasal congestion, sinus pressure, sore throat.  Admits shortness of breath worse with activity, admits body aches, fatigue, hot and cold but no documented fever.  Admits potential exposures at work, she is a Engineer, civil (consulting) in the emergency department.  Admits recent travel went to the beach 2 weeks ago.  Denies calf pain or swelling, smoking, history of DVT.  Kyleena IUD. LMP 2 weeks ago  Past Medical History:  Diagnosis Date   Thyroid disease     Patient Active Problem List   Diagnosis Date Noted   Physical exam, annual 08/11/2023   Postablative hypothyroidism 08/11/2023   IUD (intrauterine device) in place 07/24/2017   Fibroids, intramural 07/24/2017   Obesity (BMI 30.0-34.9) 07/15/2017   Toxic diffuse goiter 08/03/2009    Past Surgical History:  Procedure Laterality Date   kyleena iud     inserted 06-12-17, removed & re-inserted 07-06-21    OB History     Gravida  0   Para  0   Term  0   Preterm  0   AB  0   Living  0      SAB  0   IAB  0   Ectopic  0   Multiple  0   Live Births  0            Home Medications    Prior to Admission medications   Medication Sig Start Date End Date Taking? Authorizing Provider  Levonorgestrel (KYLEENA IU) by Intrauterine route. Placed 06/12/17    [provider]  levothyroxine (SYNTHROID) 175 MCG tablet Take 175 mcg by mouth daily.    [provider]    Family History Family History  Problem  Relation Age of Onset   Thyroid disease Mother    Thyroid disease Sister    Down syndrome Sister    Diabetes Paternal Aunt    Diabetes Paternal Uncle    Lung cancer Maternal Grandfather    Prostate cancer Paternal Grandfather     Social History Social History   Tobacco Use   Smoking status: Never   Smokeless tobacco: Never  Vaping Use   Vaping status: Never Used  Substance Use Topics   Alcohol use: Yes    Alcohol/week: 3.0 standard drinks of alcohol    Types: 3 Glasses of wine per week   Drug use: Never     Allergies   Patient has no known allergies.   Review of Systems Review of Systems  Constitutional:  Positive for chills, fatigue and fever. Negative for appetite change.  HENT:  Positive for rhinorrhea, sinus pressure and sore throat. Negative for ear pain.   Respiratory:  Positive for cough and shortness of breath. Negative for wheezing.   Cardiovascular:  Positive for chest pain.  Gastrointestinal:  Negative for diarrhea, nausea and vomiting.  Skin:  Negative for rash.     Physical Exam Triage Vital Signs ED Triage Vitals  Encounter Vitals Group     BP 09/29/23 1550 127/80     Systolic BP Percentile --      Diastolic BP Percentile --      Pulse Rate 09/29/23 1550 90     Resp 09/29/23 1550 16     Temp 09/29/23 1550 98.8 F (37.1 C)     Temp Source 09/29/23 1550 Oral     SpO2 09/29/23 1550 94 %     Weight 09/29/23 1550 211 lb (95.7 kg)     Height 09/29/23 1550 5\' 8"  (1.727 m)     Head Circumference --      Peak Flow --      Pain Score 09/29/23 1549 4     Pain Loc --      Pain Education --      Exclude from Growth Chart --    No data found.  Updated Vital Signs BP 127/80 (BP Location: Left Arm)   Pulse 90   Temp 98.8 F (37.1 C) (Oral)   Resp 16   Ht 5\' 8"  (1.727 m)   Wt 211 lb (95.7 kg)   LMP 09/13/2023 (Exact Date)   SpO2 94%   BMI 32.08 kg/m   Visual Acuity Right Eye Distance:   Left Eye Distance:   Bilateral Distance:    Right  Eye Near:   Left Eye Near:    Bilateral Near:     Physical Exam Vitals and nursing note reviewed.  Constitutional:      Appearance: She is not ill-appearing.  HENT:     Head: Normocephalic and atraumatic.     Right Ear: Tympanic membrane and ear canal normal.     Left Ear: Tympanic membrane and ear canal normal.     Nose: No rhinorrhea.     Mouth/Throat:     Mouth: Mucous membranes are moist.  Eyes:     Conjunctiva/sclera: Conjunctivae normal.  Cardiovascular:     Rate and Rhythm: Normal rate and regular rhythm.  Pulmonary:     Effort: No respiratory distress.     Breath sounds: Rhonchi (right lower fields posterior) present. No wheezing or rales.  Musculoskeletal:     Cervical back: Neck supple.  Skin:    General: Skin is warm.  Neurological:     Mental Status: She is alert.      UC Treatments / Results  Labs (all labs ordered are listed, but only abnormal results are displayed) Labs Reviewed - No data to display  EKG   Radiology No results found.  Procedures Procedures (including critical care time)  Medications Ordered in UC Medications - No data to display  Initial Impression / Assessment and Plan / UC Course  I have reviewed the triage vital signs and the nursing notes.  Pertinent labs & imaging results that were available during my care of the patient were reviewed by me and considered in my medical decision making (see chart for details).     37 year old female with cough chest congestion URI symptoms for 2 weeks, active fever, dyspnea on exertion. She is afebrile, repeat oxygen saturation 96%, heart rate 90 respiratory rate 16 Mild rhonchi on exam, chest x-ray independently viewed by me VS infiltrate Concern for pneumonia will treat as such, recommend recheck by PCP in 2 days, strict ED precautions reviewed Final Clinical Impressions(s) / UC Diagnoses   Final diagnoses:  None   Discharge Instructions   None    ED Prescriptions   None     PDMP  not reviewed this encounter.   Meliton Rattan, [place] 09/29/23 1644

## 2023-09-29 NOTE — Discharge Instructions (Addendum)
The x-ray reading we discussed is preliminary. Your x-ray will be read by a radiologist in next few hours. If there is a discrepancy, you will be contacted, and instructed on a new plan for you care.   The emergency department for worsening symptoms new symptoms or concerns

## 2023-09-29 NOTE — ED Triage Notes (Signed)
Patient here today with c/o cough, SOB, congestion, ST, and runny nose X 10 days. She has been [employer], Nyquil, Robitussin, Theraflu, and an OTC inhaler with some relief. 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

## 2023-10-02 ENCOUNTER — Ambulatory Visit (INDEPENDENT_AMBULATORY_CARE_PROVIDER_SITE_OTHER): Payer: Managed Care, Other (non HMO) | Admitting: Family Medicine

## 2023-10-02 ENCOUNTER — Encounter: Payer: Self-pay | Admitting: Family Medicine

## 2023-10-02 VITALS — BP 106/70 | HR 102 | Temp 98.8°F | Ht 68.0 in | Wt 208.0 lb

## 2023-10-02 DIAGNOSIS — J069 Acute upper respiratory infection, unspecified: Secondary | ICD-10-CM | POA: Insufficient documentation

## 2023-10-02 NOTE — Assessment & Plan Note (Signed)
Symptoms improving, continue antibiotics as prescribed. Continue supportive care. Increase fluid intake with water or electrolyte solution like pedialyte. Encouraged acetaminophen as needed for fever/pain. Encouraged salt water gargling, chloraseptic spray and throat lozenges. Encouraged OTC guaifenesin. Encouraged saline sinus flushes and/or neti with humidified air. Return to office if symptoms persist or worsen.

## 2023-10-02 NOTE — Progress Notes (Signed)
Subjective:  HPI: Susan Johnson is a 37 y.o. female presenting on 10/02/2023 for Follow-up (F/u UC for poss PNA)   HPI Patient is in today for follow-up for URI seen in urgent care. CXR was negative. She reports today symptoms are much improved. She is on day 4 of her antibiotics and will have them completed tomorrow. She still has a mild cough that is dry. Denies fever, chills, body aches, N/V/D, wheezing. She does endorse mild shortness of breath with exertion relieved with albuterol. Cough is relieved with PRN cough syrup and she is using this nightly.   Review of Systems  All other systems reviewed and are negative.   Relevant past medical history reviewed and updated as indicated.   Past Medical History:  Diagnosis Date   Thyroid disease      Past Surgical History:  Procedure Laterality Date   kyleena iud     inserted 06-12-17, removed & re-inserted 07-06-21    Allergies and medications reviewed and updated.   Current Outpatient Medications:    albuterol (VENTOLIN HFA) 108 (90 Base) MCG/ACT inhaler, Inhale 2 puffs into the lungs every 6 (six) hours as needed for up to 10 days for wheezing or shortness of breath., Disp: 6.7 g, Rfl: 0   amoxicillin (AMOXIL) 500 MG capsule, Take 2 capsules (1,000 mg total) by mouth 2 (two) times daily for 5 days., Disp: 20 capsule, Rfl: 0   azithromycin (ZITHROMAX) 250 MG tablet, Take 1 tablet (250 mg total) by mouth daily. Take first 2 tablets together, then 1 every day until finished., Disp: 6 tablet, Rfl: 0   Levonorgestrel (KYLEENA IU), by Intrauterine route. Placed 06/12/17, Disp: , Rfl:    levothyroxine (SYNTHROID) 175 MCG tablet, Take 175 mcg by mouth daily., Disp: , Rfl:    promethazine-dextromethorphan (PROMETHAZINE-DM) 6.25-15 MG/5ML syrup, Take 5 mLs by mouth 4 (four) times daily as needed for up to 6 days for cough., Disp: 118 mL, Rfl: 0  No Known Allergies  Objective:   BP 106/70   Pulse (!) 102   Temp 98.8 F (37.1  C) (Oral)   Ht 5\' 8"  (1.727 m)   Wt 208 lb (94.3 kg)   LMP 09/13/2023 (Approximate)   SpO2 93%   BMI 31.63 kg/m      10/02/2023    3:27 PM 09/29/2023    3:50 PM 08/11/2023   10:35 AM  Vitals with BMI  Height 5\' 8"  5\' 8"  5\' 7"   Weight 208 lbs 211 lbs 216 lbs  BMI 31.63 32.09 33.82  Systolic 106 127 518  Diastolic 70 80 78  Pulse 102 90 82     Physical Exam Vitals and nursing note reviewed.  Constitutional:      Appearance: Normal appearance. She is normal weight.  HENT:     Head: Normocephalic and atraumatic.  Cardiovascular:     Rate and Rhythm: Normal rate and regular rhythm.     Pulses: Normal pulses.     Heart sounds: Normal heart sounds.  Pulmonary:     Effort: Pulmonary effort is normal.     Breath sounds: Examination of the right-upper field reveals rhonchi. Examination of the left-upper field reveals rhonchi. Examination of the right-lower field reveals rhonchi. Examination of the left-lower field reveals rhonchi. Rhonchi present.  Skin:    General: Skin is warm and dry.  Neurological:     General: No focal deficit present.     Mental Status: She is alert and oriented to person, place, and  time. Mental status is at baseline.  Psychiatric:        Mood and Affect: Mood normal.        Behavior: Behavior normal.        Thought Content: Thought content normal.        Judgment: Judgment normal.     Assessment & Plan:  URI with cough and congestion Assessment & Plan: Symptoms improving, continue antibiotics as prescribed. Continue supportive care. Increase fluid intake with water or electrolyte solution like pedialyte. Encouraged acetaminophen as needed for fever/pain. Encouraged salt water gargling, chloraseptic spray and throat lozenges. Encouraged OTC guaifenesin. Encouraged saline sinus flushes and/or neti with humidified air. Return to office if symptoms persist or worsen.       Follow up plan: Return if symptoms worsen or fail to improve.  Park Meo, FNP

## 2023-12-17 DIAGNOSIS — E041 Nontoxic single thyroid nodule: Secondary | ICD-10-CM | POA: Diagnosis not present

## 2023-12-17 DIAGNOSIS — E890 Postprocedural hypothyroidism: Secondary | ICD-10-CM | POA: Diagnosis not present

## 2023-12-24 ENCOUNTER — Ambulatory Visit (INDEPENDENT_AMBULATORY_CARE_PROVIDER_SITE_OTHER): Payer: Managed Care, Other (non HMO) | Admitting: Family Medicine

## 2023-12-24 ENCOUNTER — Other Ambulatory Visit: Payer: Self-pay

## 2023-12-24 ENCOUNTER — Emergency Department (HOSPITAL_COMMUNITY)
Admission: EM | Admit: 2023-12-24 | Discharge: 2023-12-24 | Disposition: A | Discharge status: Home / Self Care | Payer: Managed Care, Other (non HMO) | Attending: Emergency Medicine | Admitting: Emergency Medicine

## 2023-12-24 ENCOUNTER — Encounter (HOSPITAL_COMMUNITY): Payer: Self-pay

## 2023-12-24 ENCOUNTER — Emergency Department (HOSPITAL_COMMUNITY): Payer: Managed Care, Other (non HMO)

## 2023-12-24 VITALS — BP 114/62 | HR 94 | Temp 98.2°F | Ht 68.0 in | Wt 205.0 lb

## 2023-12-24 DIAGNOSIS — R052 Subacute cough: Secondary | ICD-10-CM | POA: Diagnosis not present

## 2023-12-24 DIAGNOSIS — J069 Acute upper respiratory infection, unspecified: Secondary | ICD-10-CM

## 2023-12-24 DIAGNOSIS — R059 Cough, unspecified: Secondary | ICD-10-CM | POA: Diagnosis present

## 2023-12-24 MED ORDER — ALBUTEROL SULFATE HFA 108 (90 BASE) MCG/ACT IN AERS
2.0000 | INHALATION_SPRAY | Freq: Four times a day (QID) | RESPIRATORY_TRACT | 0 refills | Status: DC | PRN
Start: 2023-12-24 — End: 2024-02-24

## 2023-12-24 MED ORDER — HYDROCODONE BIT-HOMATROP MBR 5-1.5 MG/5ML PO SOLN
5.0000 mL | Freq: Three times a day (TID) | ORAL | 0 refills | Status: DC | PRN
Start: 2023-12-24 — End: 2024-01-30

## 2023-12-24 MED ORDER — AZITHROMYCIN 250 MG PO TABS
ORAL_TABLET | ORAL | 0 refills | Status: AC
Start: 2023-12-24 — End: 2023-12-29

## 2023-12-24 NOTE — ED Triage Notes (Signed)
Pt referred to the ER by her PCP. Pt has a cough that's been present the end of December. Pt told to go the ER for chest XR to rule out pneumonia.

## 2023-12-24 NOTE — Discharge Instructions (Signed)
As we discussed, your workup in the ER today was reassuring for acute findings.  X-ray imaging of your chest did not show any concerning findings.  I recommend that you take the medications that were prescribed to you by your primary doctor earlier today and follow-up closely with them.  Return if development of any new or worsening symptoms.

## 2023-12-24 NOTE — Assessment & Plan Note (Signed)
Patient presents with prolonged viral URI symptoms unrelieved with conservative management. Right ear consistent AOM. In setting of recent history pneumonia, prolonged SOB and wheezing, and exam findings will obtain CXR and start Azithromycin. Flu, COVID, RSV testing done. Continue supportive management. Seek medical care if symptoms persist or worsen.

## 2023-12-24 NOTE — Progress Notes (Signed)
Subjective:  HPI: Susan Johnson is a 38 y.o. female presenting on 12/24/2023 for Acute Visit (Room 1 / Persistent cough/shortness of breath, right ear pain. Been going on for 2 1/2 weeks now per pt/)   HPI Patient is in today for 2.5 weeks dry cough, shortness of breath, wheezing, and right ear pain. She also endorses rhinorrhea and sinus congestion.  Denies fever, chills, body aches Sick exposures yes, she is ED nurse Has tried Mucinex, Tussin, and cough syrup.  Review of Systems  All other systems reviewed and are negative.   Relevant past medical history reviewed and updated as indicated.   Past Medical History:  Diagnosis Date   Thyroid disease      Past Surgical History:  Procedure Laterality Date   kyleena iud     inserted 06-12-17, removed & re-inserted 07-06-21    Allergies and medications reviewed and updated.   Current Outpatient Medications:    albuterol (VENTOLIN HFA) 108 (90 Base) MCG/ACT inhaler, Inhale 2 puffs into the lungs every 6 (six) hours as needed for wheezing or shortness of breath., Disp: 8 g, Rfl: 0   azithromycin (ZITHROMAX) 250 MG tablet, Take 2 tablets on day 1, then 1 tablet daily on days 2 through 5, Disp: 6 tablet, Rfl: 0   HYDROcodone bit-homatropine (HYCODAN) 5-1.5 MG/5ML syrup, Take 5 mLs by mouth every 8 (eight) hours as needed for cough., Disp: 120 mL, Rfl: 0   Levonorgestrel (KYLEENA IU), by Intrauterine route. Placed 06/12/17, Disp: , Rfl:    levothyroxine (SYNTHROID) 150 MCG tablet, Take 150 mcg by mouth daily., Disp: , Rfl:   No Known Allergies  Objective:   BP 114/62   Pulse 94   Temp 98.2 F (36.8 C) (Oral)   Ht 5\' 8"  (1.727 m)   Wt 205 lb (93 kg)   LMP 12/08/2023   SpO2 98%   BMI 31.17 kg/m      12/24/2023    2:07 PM 10/02/2023    3:27 PM 09/29/2023    3:50 PM  Vitals with BMI  Height 5\' 8"  5\' 8"  5\' 8"   Weight 205 lbs 208 lbs 211 lbs  BMI 31.18 31.63 32.09  Systolic 114 106 409  Diastolic 62 70 80  Pulse  94 102 90     Physical Exam Vitals and nursing note reviewed.  Constitutional:      Appearance: Normal appearance. She is normal weight.  HENT:     Head: Normocephalic and atraumatic.     Right Ear: Ear canal and external ear normal. Tympanic membrane is erythematous and bulging.     Left Ear: Tympanic membrane, ear canal and external ear normal.     Nose: Congestion present.  Cardiovascular:     Rate and Rhythm: Normal rate and regular rhythm.     Pulses: Normal pulses.     Heart sounds: Normal heart sounds.  Pulmonary:     Effort: Pulmonary effort is normal.     Breath sounds: Examination of the right-lower field reveals wheezing and rhonchi. Wheezing and rhonchi present.  Musculoskeletal:     Cervical back: Tenderness present.  Lymphadenopathy:     Cervical: No cervical adenopathy.  Skin:    General: Skin is warm and dry.  Neurological:     General: No focal deficit present.     Mental Status: She is alert and oriented to person, place, and time. Mental status is at baseline.  Psychiatric:        Mood and Affect:  Mood normal.        Behavior: Behavior normal.        Thought Content: Thought content normal.        Judgment: Judgment normal.     Assessment & Plan:  Viral URI with cough Assessment & Plan: Patient presents with prolonged viral URI symptoms unrelieved with conservative management. Right ear consistent AOM. In setting of recent history pneumonia, prolonged SOB and wheezing, and exam findings will obtain CXR and start Azithromycin. Flu, COVID, RSV testing done. Continue supportive management. Seek medical care if symptoms persist or worsen.  Orders: -     DG Chest 2 View; Future -     SARS-CoV-2 RNA, Influenza A/B, and RSV RNA, Qualitative NAAT  Other orders -     Albuterol Sulfate HFA; Inhale 2 puffs into the lungs every 6 (six) hours as needed for wheezing or shortness of breath.  Dispense: 8 g; Refill: 0 -     HYDROcodone Bit-Homatrop MBr; Take 5 mLs by  mouth every 8 (eight) hours as needed for cough.  Dispense: 120 mL; Refill: 0 -     Azithromycin; Take 2 tablets on day 1, then 1 tablet daily on days 2 through 5  Dispense: 6 tablet; Refill: 0     Follow up plan: Return if symptoms worsen or fail to improve.  Park Meo, FNP

## 2023-12-24 NOTE — ED Provider Notes (Signed)
[place] [facility] AT [facility] Provider Note   CSN: [id number] Arrival date & time: 12/24/23  1457     History  No chief complaint on file.   Susan Johnson is a 38 y.o. female.  Patient with noncontributory past medical history presents today with complaints of cough and congestion.  She states that same has been ongoing for the last 2-1/2 weeks.  Does note sick contacts.  No fevers or chills.  No shortness of breath.  She has tried over-the-counter medications including Mucinex, Tessalon, and cough syrup with minimal relief.  She went to her primary care provider this morning and was prescribed azithromycin, albuterol, and codeine cough syrup for management of her symptoms, however was sent here for a chest x-ray to rule out developing pneumonia.  The history is provided by the patient. No language interpreter was used.       Home Medications Prior to Admission medications   Medication Sig Start Date End Date Taking? Authorizing Provider  albuterol (VENTOLIN HFA) 108 (90 Base) MCG/ACT inhaler Inhale 2 puffs into the lungs every 6 (six) hours as needed for wheezing or shortness of breath. 12/24/23   Park Meo, FNP  azithromycin (ZITHROMAX) 250 MG tablet Take 2 tablets on day 1, then 1 tablet daily on days 2 through 5 12/24/23 12/29/23  Park Meo, FNP  HYDROcodone bit-homatropine (HYCODAN) 5-1.5 MG/5ML syrup Take 5 mLs by mouth every 8 (eight) hours as needed for cough. 12/24/23   Park Meo, FNP  Levonorgestrel (KYLEENA IU) by Intrauterine route. Placed 06/12/17    [provider]  levothyroxine (SYNTHROID) 150 MCG tablet Take 150 mcg by mouth daily.    [provider]      Allergies    Patient has no known allergies.    Review of Systems   Review of Systems  HENT:  Positive for congestion.   Respiratory:  Positive for cough.   All other systems reviewed and are negative.   Physical Exam Updated Vital  Signs BP 134/89 (BP Location: Left Arm)   Pulse 77   Temp 98 F (36.7 C) (Oral)   Resp 16   LMP 12/08/2023   SpO2 100%  Physical Exam Vitals and nursing note reviewed.  Constitutional:      General: She is not in acute distress.    Appearance: Normal appearance. She is normal weight. She is not ill-appearing, toxic-appearing or diaphoretic.  HENT:     Head: Normocephalic and atraumatic.  Cardiovascular:     Rate and Rhythm: Normal rate.  Pulmonary:     Effort: Pulmonary effort is normal. No respiratory distress.     Breath sounds: Normal breath sounds.  Musculoskeletal:        General: Normal range of motion.     Cervical back: Normal range of motion.  Skin:    General: Skin is warm and dry.  Neurological:     General: No focal deficit present.     Mental Status: She is alert.  Psychiatric:        Mood and Affect: Mood normal.        Behavior: Behavior normal.     ED Results / Procedures / Treatments   Labs (all labs ordered are listed, but only abnormal results are displayed) Labs Reviewed - No data to display  EKG None  Radiology DG Chest 2 View Result Date: 12/24/2023 CLINICAL DATA:  Cough. EXAM: CHEST - 2 VIEW COMPARISON:  Chest radiograph dated September 29, 2023. FINDINGS: The heart size and mediastinal contours are within normal limits. No focal consolidation, pleural effusion, or pneumothorax. No acute osseous abnormality. IMPRESSION: No acute cardiopulmonary findings. Electronically Signed   By: Hart Robinsons M.D.   On: 12/24/2023 16:43    Procedures Procedures    Medications Ordered in ED Medications - No data to display  ED Course/ Medical Decision Making/ A&P                                 Medical Decision Making Amount and/or Complexity of Data Reviewed Radiology: ordered.   Patient presents today with complaints of cough and congestion x 2.5 weeks.  They are afebrile, nontoxic-appearing, and in no acute distress with reassuring vital  signs.  Physical exam reveals well-appearing patient speaking in complete sentences.  Chest x-ray obtained which has resulted and reveals no acute findings.  I personally reviewed and interpreted this imaging and agree with radiology interpretation.  Chart reviewed, patient seen by her primary doctor this morning and was given albuterol, azithromycin, and codeine cough syrup for her symptoms.came here today for chest x-ray to rule out pneumonia.  She has previously been swabbed and was negative for COVID, flu, and RSV.  Recommended patient take the medications prescribed to her by her primary doctor and follow-up closely with them in the office. Evaluation and diagnostic testing in the emergency department does not suggest an emergent condition requiring admission or immediate intervention beyond what has been performed at this time.  Plan for discharge with close PCP follow-up.  Patient is understanding and amenable with plan, educated on red flag symptoms that would prompt immediate return.  Patient discharged in stable condition.  Final Clinical Impression(s) / ED Diagnoses Final diagnoses:  Subacute cough    Rx / DC Orders ED Discharge Orders     None     An After Visit Summary was printed and given to the patient.     Silva Bandy, PA-C 12/24/23 1658    Rozelle Logan, DO 12/24/23 1712

## 2023-12-26 LAB — SARS-COV-2 RNA, INFLUENZA A/B, AND RSV RNA, QUALITATIVE NAAT
INFLUENZA A RNA: NOT DETECTED
INFLUENZA B RNA: NOT DETECTED
RSV RNA: NOT DETECTED
SARS COV2 RNA: NOT DETECTED

## 2024-01-01 ENCOUNTER — Telehealth: Payer: Self-pay

## 2024-01-01 NOTE — Progress Notes (Signed)
Transition Care Management Unsuccessful Follow-up Telephone Call  Date of discharge and from where:  [facility] 1/22  Attempts:  1st Attempt  Reason for unsuccessful TCM follow-up call:  No answer/busy   Lenard Forth [facility] Guide, Phone: [phone] Fax: [phone] Website: [email or web address]

## 2024-01-02 ENCOUNTER — Telehealth: Payer: Self-pay

## 2024-01-02 NOTE — Progress Notes (Signed)
Transition Care Management Unsuccessful Follow-up Telephone Call  Date of discharge and from where:  [facility] 1/22  Attempts:  2nd Attempt  Reason for unsuccessful TCM follow-up call:  No answer/busy   Lenard Forth [facility] Guide, Phone: [phone] Fax: [phone] Website: [email or web address]

## 2024-01-29 ENCOUNTER — Telehealth: Payer: Managed Care, Other (non HMO) | Admitting: Physician Assistant

## 2024-01-29 DIAGNOSIS — B9689 Other specified bacterial agents as the cause of diseases classified elsewhere: Secondary | ICD-10-CM | POA: Diagnosis not present

## 2024-01-29 DIAGNOSIS — J208 Acute bronchitis due to other specified organisms: Secondary | ICD-10-CM

## 2024-01-30 MED ORDER — PREDNISONE 20 MG PO TABS: 40.0000 mg | ORAL_TABLET | Freq: Every day | ORAL | 0 refills | Status: DC

## 2024-01-30 MED ORDER — DOXYCYCLINE HYCLATE 100 MG PO TABS: 100.0000 mg | ORAL_TABLET | Freq: Two times a day (BID) | ORAL | 0 refills | Status: DC

## 2024-01-30 MED ORDER — BENZONATATE 100 MG PO CAPS: 100.0000 mg | ORAL_CAPSULE | Freq: Three times a day (TID) | ORAL | 0 refills | Status: DC | PRN

## 2024-01-30 NOTE — Progress Notes (Signed)
 E-Visit for Cough   We are sorry that you are not feeling well.  Here is how we plan to help!  Based on your presentation I believe you most likely have A cough due to bacteria.  When patients have a fever and a productive cough with a change in color or increased sputum production, we are concerned about bacterial bronchitis.  If left untreated it can progress to pneumonia.  If your symptoms do not improve with your treatment plan it is important that you contact your provider.   I have prescribed Doxycycline 100 mg twice a day for 7 days     In addition you may use A non-prescription cough medication called Mucinex DM: take 2 tablets every 12 hours. and A prescription cough medication called Tessalon Perles 100mg . You may take 1-2 capsules every 8 hours as needed for your cough.  I have also prescribed Prednisone 20mg  Take 2 tablets (40mg ) daily for 5 days.  From your responses in the eVisit questionnaire you describe inflammation in the upper respiratory tract which is causing a significant cough.  This is commonly called Bronchitis and has four common causes:   Allergies Viral Infections Acid Reflux Bacterial Infection Allergies, viruses and acid reflux are treated by controlling symptoms or eliminating the cause. An example might be a cough caused by taking certain blood pressure medications. You stop the cough by changing the medication. Another example might be a cough caused by acid reflux. Controlling the reflux helps control the cough.  USE OF BRONCHODILATOR ("RESCUE") INHALERS: There is a risk from using your bronchodilator too frequently.  The risk is that over-reliance on a medication which only relaxes the muscles surrounding the breathing tubes can reduce the effectiveness of medications prescribed to reduce swelling and congestion of the tubes themselves.  Although you feel brief relief from the bronchodilator inhaler, your asthma may actually be worsening with the tubes becoming  more swollen and filled with mucus.  This can delay other crucial treatments, such as oral steroid medications. If you need to use a bronchodilator inhaler daily, several times per day, you should discuss this with your provider.  There are probably better treatments that could be used to keep your asthma under control.     HOME CARE Only take medications as instructed by your medical team. Complete the entire course of an antibiotic. Drink plenty of fluids and get plenty of rest. Avoid close contacts especially the very young and the elderly Cover your mouth if you cough or cough into your sleeve. Always remember to wash your hands A steam or ultrasonic humidifier can help congestion.   GET HELP RIGHT AWAY IF: You develop worsening fever. You become short of breath You cough up blood. Your symptoms persist after you have completed your treatment plan MAKE SURE YOU  Understand these instructions. Will watch your condition. Will get help right away if you are not doing well or get worse.    Thank you for choosing an e-visit.  Your e-visit answers were reviewed by a board certified advanced clinical practitioner to complete your personal care plan. Depending upon the condition, your plan could have included both over the counter or prescription medications.  Please review your pharmacy choice. Make sure the pharmacy is open so you can pick up prescription now. If there is a problem, you may contact your provider through [employer] and have the prescription routed to another pharmacy.  Your safety is important to [place]. If you have drug allergies  check your prescription carefully.   For the next 24 hours you can use MyChart to ask questions about today's visit, request a non-urgent call back, or ask for a work or school excuse. You will get an email in the next two days asking about your experience. I hope that your e-visit has been valuable and will speed your recovery.   I have  spent 5 minutes in review of e-visit questionnaire, review and updating patient chart, medical decision making and response to patient.   Margaretann Loveless, PA-C

## 2024-02-24 ENCOUNTER — Ambulatory Visit (INDEPENDENT_AMBULATORY_CARE_PROVIDER_SITE_OTHER)

## 2024-02-24 ENCOUNTER — Ambulatory Visit (HOSPITAL_COMMUNITY)
Admission: RE | Admit: 2024-02-24 | Discharge: 2024-02-24 | Disposition: A | Discharge status: Home / Self Care | Source: Ambulatory Visit | Attending: Emergency Medicine | Admitting: Emergency Medicine

## 2024-02-24 ENCOUNTER — Encounter (HOSPITAL_COMMUNITY): Payer: Self-pay

## 2024-02-24 VITALS — BP 113/77 | HR 88 | Temp 97.8°F | Resp 18

## 2024-02-24 DIAGNOSIS — R0602 Shortness of breath: Secondary | ICD-10-CM

## 2024-02-24 DIAGNOSIS — R052 Subacute cough: Secondary | ICD-10-CM

## 2024-02-24 DIAGNOSIS — R079 Chest pain, unspecified: Secondary | ICD-10-CM

## 2024-02-24 MED ORDER — BUDESONIDE-FORMOTEROL FUMARATE 80-4.5 MCG/ACT IN AERO: 2.0000 | INHALATION_SPRAY | Freq: Two times a day (BID) | RESPIRATORY_TRACT | 1 refills | Status: DC

## 2024-02-24 MED ORDER — ALBUTEROL SULFATE HFA 108 (90 BASE) MCG/ACT IN AERS
2.0000 | INHALATION_SPRAY | Freq: Four times a day (QID) | RESPIRATORY_TRACT | 0 refills | Status: DC | PRN
Start: 2024-02-24 — End: 2024-03-31

## 2024-02-24 NOTE — Discharge Instructions (Addendum)
 As discussed based on my interpretation of your x-ray there is no obvious pneumonia or cardiopulmonary disease.  If radiology report reveals differently you will receive a phone call from me later today.   Start using Symbicort inhaler twice daily, once in the morning and once at night. Use albuterol inhaler every 6 hours as needed for wheezing and shortness of breath. You can continue taking over-the-counter medication for cough as needed. Follow-up with your primary care to get a referral to pulmonology for further evaluation and management. Return here as needed. If you develop worsening shortness of breath, chest pain, tachycardia, or any new or worsening symptoms please seek immediate medical treatment in the emergency department.

## 2024-02-24 NOTE — ED Provider Notes (Signed)
 [facility]    CSN: [id number] Arrival date & time: 02/24/24  1421      History   Chief Complaint Chief Complaint  Patient presents with   Appointment    HPI Susan Johnson is a 38 y.o. female.   Patient presents with persistent dry cough since October 2024.  Patient has been treated multiple times for pneumonia and bronchitis without relief.  Patient has taking amoxicillin, 2 rounds of azithromycin, doxycycline, and 2 rounds of prednisone without relief.  Patient also endorses persistent shortness of breath that has progressively worsened over the last few months.  Patient states that shortness of breath is worsened with exertion.  Patient states that yesterday she began to have right-sided chest pain that is constant.  Patient states that she has been using albuterol inhaler with minimal relief.  Patient states that she is also taking ibuprofen and applying heat which does not help her chest pain.  Patient states that she has been taking DayQuil, NyQuil, and Tessalon with some relief of her cough.  Denies history of asthma or COPD.  Denies smoking history.      Past Medical History:  Diagnosis Date   Thyroid disease     Patient Active Problem List   Diagnosis Date Noted   Viral URI with cough 12/24/2023   URI with cough and congestion 10/02/2023   Physical exam, annual 08/11/2023   Postablative hypothyroidism 08/11/2023   COVID-19 12/07/2021   Cough 11/11/2020   Hyperthyroidism 08/26/2017   IUD (intrauterine device) in place 07/24/2017   Fibroids, intramural 07/24/2017   Obesity (BMI 30.0-34.9) 07/15/2017   Toxic diffuse goiter 08/03/2009    Past Surgical History:  Procedure Laterality Date   kyleena iud     inserted 06-12-17, removed & re-inserted 07-06-21    OB History     Gravida  0   Para  0   Term  0   Preterm  0   AB  0   Living  0      SAB  0   IAB  0   Ectopic  0   Multiple  0   Live Births  0             Home Medications    Prior to Admission medications   Medication Sig Start Date End Date Taking? Authorizing Provider  budesonide-formoterol (SYMBICORT) 80-4.5 MCG/ACT inhaler Inhale 2 puffs into the lungs 2 (two) times daily. 02/24/24  Yes Wynonia Lawman A, NP  albuterol (VENTOLIN HFA) 108 (90 Base) MCG/ACT inhaler Inhale 2 puffs into the lungs every 6 (six) hours as needed for wheezing or shortness of breath. 02/24/24   Wynonia Lawman A, NP  Levonorgestrel (KYLEENA IU) by Intrauterine route. Placed 06/12/17    [provider]  levothyroxine (SYNTHROID) 150 MCG tablet Take 150 mcg by mouth daily.    [provider]    Family History Family History  Problem Relation Age of Onset   Thyroid disease Mother    Thyroid disease Sister    Down syndrome Sister    Diabetes Paternal Aunt    Diabetes Paternal Uncle    Lung cancer Maternal Grandfather    Prostate cancer Paternal Grandfather     Social History Social History   Tobacco Use   Smoking status: Never   Smokeless tobacco: Never  Vaping Use   Vaping status: Never Used  Substance Use Topics   Alcohol use: Yes    Alcohol/week: 3.0 standard drinks of alcohol  Types: 3 Glasses of wine per week   Drug use: Never     Allergies   Patient has no known allergies.   Review of Systems Review of Systems  Per HPI  Physical Exam Triage Vital Signs ED Triage Vitals [02/24/24 1439]  Encounter Vitals Group     BP 113/77     Systolic BP Percentile      Diastolic BP Percentile      Pulse Rate 88     Resp 18     Temp 97.8 F (36.6 C)     Temp Source Oral     SpO2 96 %     Weight      Height      Head Circumference      Peak Flow      Pain Score      Pain Loc      Pain Education      Exclude from Growth Chart    No data found.  Updated Vital Signs BP 113/77 (BP Location: Right Arm)   Pulse 88   Temp 97.8 F (36.6 C) (Oral)   Resp 18   LMP 02/22/2024 (Exact Date)   SpO2 96%   Visual  Acuity Right Eye Distance:   Left Eye Distance:   Bilateral Distance:    Right Eye Near:   Left Eye Near:    Bilateral Near:     Physical Exam Vitals and nursing note reviewed.  Constitutional:      General: She is awake. She is not in acute distress.    Appearance: Normal appearance. She is well-developed and well-groomed. She is not ill-appearing.  HENT:     Right Ear: Tympanic membrane, ear canal and external ear normal.     Left Ear: Tympanic membrane, ear canal and external ear normal.     Nose: Congestion and rhinorrhea present.     Mouth/Throat:     Mouth: Mucous membranes are moist.     Pharynx: Posterior oropharyngeal erythema present. No oropharyngeal exudate.  Cardiovascular:     Rate and Rhythm: Normal rate and regular rhythm.  Pulmonary:     Effort: Pulmonary effort is normal. Tachypnea present.     Breath sounds: Examination of the right-upper field reveals decreased breath sounds. Examination of the right-middle field reveals decreased breath sounds. Examination of the right-lower field reveals decreased breath sounds. Decreased breath sounds present.     Comments: Tachypnea on exertion. Skin:    General: Skin is warm and dry.  Neurological:     Mental Status: She is alert.  Psychiatric:        Behavior: Behavior is cooperative.      UC Treatments / Results  Labs (all labs ordered are listed, but only abnormal results are displayed) Labs Reviewed - No data to display  EKG   Radiology DG Chest 2 View Result Date: 02/24/2024 CLINICAL DATA:  Shortness of breath EXAM: CHEST - 2 VIEW COMPARISON:  12/24/2023 FINDINGS: The heart size and mediastinal contours are within normal limits. Both lungs are clear. The visualized skeletal structures are unremarkable. IMPRESSION: No active cardiopulmonary disease. Electronically Signed   By: Judie Petit.  Shick M.D.   On: 02/24/2024 16:09    Procedures Procedures (including critical care time)  Medications Ordered in  UC Medications - No data to display  Initial Impression / Assessment and Plan / UC Course  I have reviewed the triage vital signs and the nursing notes.  Pertinent labs & imaging results that were available during  my care of the patient were reviewed by me and considered in my medical decision making (see chart for details).     Upon assessment congestion and rhinorrhea present, mild erythema noted to pharynx.  Mild tachypnea on exertion and while speaking noted, but able to speak in full sentences.  Mildly diminished breath sounds to right lung.  EKG revealed normal sinus rhythm without ST elevation, depression, or acute cardiac findings.  Based on my interpretation of x-ray there is no active cardiopulmonary disease present.  Radiology report confirms this.  Prescribed Symbicort to start using twice daily.  Refilled albuterol inhaler as needed for wheezing and shortness of breath.  Discussed over-the-counter medication as needed for cough.  Discussed following up with primary care provider to receive a referral to pulmonologist.  Discussed return and strict ER precautions Final Clinical Impressions(s) / UC Diagnoses   Final diagnoses:  Shortness of breath  Chest pain, unspecified type  Subacute cough     Discharge Instructions      As discussed based on my interpretation of your x-ray there is no obvious pneumonia or cardiopulmonary disease.  If radiology report reveals differently you will receive a phone call from me later today.   Start using Symbicort inhaler twice daily, once in the morning and once at night. Use albuterol inhaler every 6 hours as needed for wheezing and shortness of breath. You can continue taking over-the-counter medication for cough as needed. Follow-up with your primary care to get a referral to pulmonology for further evaluation and management. Return here as needed. If you develop worsening shortness of breath, chest pain, tachycardia, or any new or  worsening symptoms please seek immediate medical treatment in the emergency department.    ED Prescriptions     Medication Sig Dispense Auth. Provider   albuterol (VENTOLIN HFA) 108 (90 Base) MCG/ACT inhaler Inhale 2 puffs into the lungs every 6 (six) hours as needed for wheezing or shortness of breath. 8 g Wynonia Lawman A, NP   budesonide-formoterol (SYMBICORT) 80-4.5 MCG/ACT inhaler Inhale 2 puffs into the lungs 2 (two) times daily. 1 each Letta Kocher, NP      PDMP not reviewed this encounter.   Wynonia Lawman A, NP 02/24/24 [phone]

## 2024-02-24 NOTE — ED Triage Notes (Signed)
 Pt had dry cough for months. Been treated for PNA. Pain on right side of chest that has gotten worse. Using inhaler for breathing. Used heat, ibuprofen, DAyquil and Nyquil that is not helping.

## 2024-02-26 ENCOUNTER — Encounter: Payer: Self-pay | Admitting: Family Medicine

## 2024-03-01 ENCOUNTER — Other Ambulatory Visit: Payer: Self-pay | Admitting: Family Medicine

## 2024-03-01 DIAGNOSIS — R053 Chronic cough: Secondary | ICD-10-CM

## 2024-03-08 ENCOUNTER — Ambulatory Visit (INDEPENDENT_AMBULATORY_CARE_PROVIDER_SITE_OTHER): Admitting: Family Medicine

## 2024-03-08 ENCOUNTER — Encounter: Payer: Self-pay | Admitting: Family Medicine

## 2024-03-08 VITALS — BP 116/78 | HR 86 | Temp 98.5°F | Ht 68.0 in | Wt 211.0 lb

## 2024-03-08 DIAGNOSIS — R0602 Shortness of breath: Secondary | ICD-10-CM

## 2024-03-08 NOTE — Progress Notes (Signed)
 Subjective:  HPI: Susan Johnson is a 38 y.o. female presenting on 03/08/2024 for Cough (Here for chronic cough, wants referral to pulmonology. )   HPI Patient is in today for chronic cough and requests for referral to pulmonology. She reports cough with small amount of clear mucus, shortness of breath, wheezing. She is using albuterol 3 times daily. Worse with physical exertion. Denies orthopnea, fever, chills, body aches, swelling of extremities, recent travel, pleurisy, history of DVT, calf pain, swelling, redness, or warmth. This has been ongoing since presumed viral URI in October of last year. Her symptoms will improve then return. She has had 2 x-rays that were negative, EKG showed NSR.   Review of Systems  All other systems reviewed and are negative.   Relevant past medical history reviewed and updated as indicated.   Past Medical History:  Diagnosis Date   Thyroid disease      Past Surgical History:  Procedure Laterality Date   kyleena iud     inserted 06-12-17, removed & re-inserted 07-06-21    Allergies and medications reviewed and updated.   Current Outpatient Medications:    albuterol (VENTOLIN HFA) 108 (90 Base) MCG/ACT inhaler, Inhale 2 puffs into the lungs every 6 (six) hours as needed for wheezing or shortness of breath., Disp: 8 g, Rfl: 0   budesonide-formoterol (SYMBICORT) 80-4.5 MCG/ACT inhaler, Inhale 2 puffs into the lungs 2 (two) times daily., Disp: 1 each, Rfl: 1   escitalopram (LEXAPRO) 10 MG tablet, Take 10 mg by mouth daily., Disp: , Rfl:    Levonorgestrel (KYLEENA IU), by Intrauterine route. Placed 06/12/17, Disp: , Rfl:    levothyroxine (SYNTHROID) 150 MCG tablet, Take 150 mcg by mouth daily., Disp: , Rfl:   No Known Allergies  Objective:   BP 116/78   Pulse 86   Temp 98.5 F (36.9 C)   Ht 5\' 8"  (1.727 m)   Wt 211 lb (95.7 kg)   LMP 02/22/2024 (Exact Date)   SpO2 98%   BMI 32.08 kg/m      03/08/2024    2:37 PM 02/24/2024    2:39 PM  12/24/2023    3:05 PM  Vitals with BMI  Height 5\' 8"     Weight 211 lbs    BMI 32.09    Systolic 116 113   Diastolic 78 77   Pulse 86 88      Information is confidential and restricted. Go to Review Flowsheets to unlock data.     Physical Exam Vitals and nursing note reviewed.  Constitutional:      Appearance: Normal appearance. She is normal weight.  HENT:     Head: Normocephalic and atraumatic.  Cardiovascular:     Rate and Rhythm: Normal rate and regular rhythm.     Pulses: Normal pulses.     Heart sounds: Normal heart sounds.  Pulmonary:     Effort: Pulmonary effort is normal.     Breath sounds: Wheezing present.  Skin:    General: Skin is warm and dry.  Neurological:     General: No focal deficit present.     Mental Status: She is alert and oriented to person, place, and time. Mental status is at baseline.  Psychiatric:        Mood and Affect: Mood normal.        Behavior: Behavior normal.        Thought Content: Thought content normal.        Judgment: Judgment normal.  Assessment & Plan:  Shortness of breath Assessment & Plan: Will obtain BNP and d-dimer today and consider ECHO vs CTA chest. X-rays unremarkable. Symptoms not controlled with albuterol. Symptoms limited to cough and SOB with exertion as well as wheezing. She is scheduled to see pulmonology. Provided with sample of AirSuppra today.  Orders: -     Brain natriuretic peptide -     D-dimer, quantitative     Follow up plan: Return if symptoms worsen or fail to improve.  Park Meo, FNP

## 2024-03-08 NOTE — Assessment & Plan Note (Signed)
 Will obtain BNP and d-dimer today and consider ECHO vs CTA chest. X-rays unremarkable. Symptoms not controlled with albuterol. Symptoms limited to cough and SOB with exertion as well as wheezing. She is scheduled to see pulmonology. Provided with sample of AirSuppra today.

## 2024-03-09 ENCOUNTER — Encounter: Payer: Self-pay | Admitting: Family Medicine

## 2024-03-09 LAB — BRAIN NATRIURETIC PEPTIDE: Brain Natriuretic Peptide: 11 pg/mL (ref <100)

## 2024-03-09 LAB — D-DIMER, QUANTITATIVE: D-Dimer, Quant: 0.36 ug{FEU}/mL (ref <0.50)

## 2024-03-30 NOTE — Progress Notes (Unsigned)
 Merrell Abate, female    DOB: 05-18-1986    MRN: [id number]   Brief patient profile:  11  yobf  never smoker with h/o seasonal nasal allergies spring time worse/ clariton best then changed to xyzal    referred to pulmonary clinic in [place]  03/31/2024 by Yolanda Hence  for cough x Sept 2024    Not prev seen by PCCM    History of Present Illness  03/31/2024  Pulmonary/ 1st office eval/ [facility] Ha[facility] / [facility] on prn symbicort  since first of April 2025  Chief Complaint  Patient presents with   Establish Care   Cough  Dyspnea:  was able to do gym as late as Sept 2024 was able to work out unless using albuterol  or symbicort  1st avg 2x weekly Cough: dry/ mostly daytime /ex makes it wose Sleep: bed is flat/ 2pillows/ some flare in am  SABA use: avg 2 puffs a day  02: none      No obvious day to day or daytime pattern/variability or assoc excess/ purulent sputum or mucus plugs or hemoptysis or cp or chest tightness, subjective wheeze or overt sinus or hb symptoms.    Also denies any obvious fluctuation of symptoms with weather or environmental changes or other aggravating or alleviating factors except as outlined above   No unusual exposure hx or h/o childhood pna/ asthma or knowledge of premature birth.  Current Allergies, Complete Past Medical History, Past Surgical History, Family History, and Social History were reviewed in [employer] record.  ROS  The following are not active complaints unless bolded Hoarseness, sore throat, dysphagia, dental problems, itching, sneezing,  nasal congestion or discharge of excess mucus or purulent secretions, ear ache,   fever, chills, sweats, unintended wt loss or wt gain, classically pleuritic or exertional cp,  orthopnea pnd or arm/hand swelling  or leg swelling, presyncope, palpitations, abdominal pain, anorexia, nausea, vomiting, diarrhea  or change in bowel habits or change in bladder habits, change in stools or  change in urine, dysuria, hematuria,  rash, arthralgias, visual complaints, headache, numbness, weakness or ataxia or problems with walking or coordination,  change in mood or  memory.            Outpatient Medications Prior to Visit  Medication Sig Dispense Refill   albuterol  (VENTOLIN  HFA) 108 (90 Base) MCG/ACT inhaler Inhale 2 puffs into the lungs every 6 (six) hours as needed for wheezing or shortness of breath. 8 g 0   budesonide -formoterol  (SYMBICORT ) 80-4.5 MCG/ACT inhaler Inhale 2 puffs into the lungs 2 (two) times daily. 1 each 1   escitalopram (LEXAPRO) 10 MG tablet Take 10 mg by mouth daily.     Levonorgestrel  (KYLEENA  IU) by Intrauterine route. Placed 06/12/17     levothyroxine (SYNTHROID) 150 MCG tablet Take 150 mcg by mouth daily.     No facility-administered medications prior to visit.    Past Medical History:  Diagnosis Date   Thyroid  disease       Objective:     BP 112/73   Pulse 76   Ht 5\' 8"  (1.727 m)   Wt 207 lb (93.9 kg)   SpO2 96%   BMI 31.47 kg/m   SpO2: 96 % RA  mod obese (by BMI) amb bf nad      HEENT : Oropharynx  clear     Nasal turbinates severe turbinate edema L >> R watery d/c   NECK :  without  apparent JVD/ palpable Nodes/TM  LUNGS: no acc muscle use,  Nl contour chest which is clear to A and P bilaterally without cough on insp or exp maneuvers   CV:  RRR  no s3 or murmur or increase in P2, and no edema   ABD:  soft and nontender   MS:  Gait nl   ext warm without deformities Or obvious joint restrictions  calf tenderness, cyanosis or clubbing    SKIN: warm and dry without lesions    NEURO:  alert, approp, nl sensorium with  no motor or cerebellar deficits apparent.       Assessment   Cough variant asthma Onset fall 2025  Allergy screen 03/31/2024 >  Eos 0. /  IgE   - 03/31/2024 rec symbicort  80 (or insurance equivalent ) 2bid and air supra prn  - - The proper method of use, as well as anticipated side effects, of a  metered-dose inhaler were discussed and demonstrated to the patient using teach back method.    Until control symptoms consistently no need to "back off the pedal" as there was a misunderstanding previously about maint vs prn's I worked on sorting out today   F/u 4 weeks with all meds in hand using a trust but verify approach to confirm accurate Medication  Reconciliation The principal here is that until we are certain that the  patients are doing what we've asked, it makes no sense to ask them to do more.          Each maintenance medication was reviewed in detail including emphasizing most importantly the difference between maintenance and prns and under what circumstances the prns are to be triggered using an action plan format where appropriate.  Total time for H and P, chart review, counseling, reviewing hfa device(s) and generating customized AVS unique to this office visit / same day charting = 45 min  with  new pt with  refractory respiratory  symptoms of uncertain etiology           Vernestine Gondola, MD 03/31/2024

## 2024-03-31 ENCOUNTER — Ambulatory Visit (INDEPENDENT_AMBULATORY_CARE_PROVIDER_SITE_OTHER): Admitting: Internal Medicine

## 2024-03-31 ENCOUNTER — Encounter: Payer: Self-pay | Admitting: Internal Medicine

## 2024-03-31 VITALS — BP 112/73 | HR 76 | Ht 68.0 in | Wt 207.0 lb

## 2024-03-31 DIAGNOSIS — J45991 Cough variant asthma: Secondary | ICD-10-CM | POA: Insufficient documentation

## 2024-03-31 MED ORDER — MOMETASONE FURO-FORMOTEROL FUM 100-5 MCG/ACT IN AERO: INHALATION_SPRAY | RESPIRATORY_TRACT | 11 refills | Status: DC

## 2024-03-31 MED ORDER — AIRSUPRA 90-80 MCG/ACT IN AERO: 2.0000 | INHALATION_SPRAY | RESPIRATORY_TRACT | 11 refills | Status: AC | PRN

## 2024-03-31 NOTE — Assessment & Plan Note (Signed)
 Onset fall 2025  Allergy screen 03/31/2024 >  Eos 0. /  IgE   - 03/31/2024 rec symbicort  80 (or insurance equivalent ) 2bid and air supra prn  - - The proper method of use, as well as anticipated side effects, of a metered-dose inhaler were discussed and demonstrated to the patient using teach back method.    Until control symptoms consistently no need to "back off the pedal" as there was a misunderstanding previously about maint vs prn's I worked on sorting out today   F/u 4 weeks with all meds in hand using a trust but verify approach to confirm accurate Medication  Reconciliation The principal here is that until we are certain that the  patients are doing what we've asked, it makes no sense to ask them to do more.          Each maintenance medication was reviewed in detail including emphasizing most importantly the difference between maintenance and prns and under what circumstances the prns are to be triggered using an action plan format where appropriate.  Total time for H and P, chart review, counseling, reviewing hfa device(s) and generating customized AVS unique to this office visit / same day charting = 45 min  with  new pt with  refractory respiratory  symptoms of uncertain etiology

## 2024-03-31 NOTE — Patient Instructions (Addendum)
 Plan A = Automatic = Always=    Symbicort  80 generic or breyna  80  or dulera 100 Take 2 puffs first thing in am and then another 2 puffs about 12 hours later.    Work on inhaler technique:  relax and gently blow all the way out then take a nice smooth full deep breath back in, triggering the inhaler at same time you start breathing in.  Hold breath in for at least  5 seconds if you can. Blow out thru nose. Rinse and gargle with water when done.  If mouth or throat bother you at all,  try brushing teeth/gums/tongue with arm and hammer toothpaste/ make a slurry and gargle and spit out.       Plan B = Backup (to supplement plan A, not to replace it) Only use your albuterol -budesonide  inhaler as a rescue medication to be used if you can't catch your breath by resting or doing a relaxed purse lip breathing pattern.  - The less you use it, the better it will work when you need it. - Ok to use the inhaler up to 2 puffs  every 4 hours if you must but call for appointment if use goes up over your usual need - Don't leave home without it !!  (think of it like the spare tire for your car)   Please remember to go to the lab department   for your tests - we will call you with the results when they are available.      Please schedule a follow up office visit in 4 weeks, sooner if needed  with all medications /inhalers/ solutions in hand so we can verify exactly what you are taking. This includes all medications from all doctors and over the counters

## 2024-04-03 LAB — CBC WITH DIFFERENTIAL/PLATELET
Basophils Absolute: 0 10*3/uL (ref 0.0–0.2)
Basos: 1 %
EOS (ABSOLUTE): 0.4 10*3/uL (ref 0.0–0.4)
Eos: 5 %
Hematocrit: 39.7 % (ref 34.0–46.6)
Hemoglobin: 12.8 g/dL (ref 11.1–15.9)
Immature Grans (Abs): 0 10*3/uL (ref 0.0–0.1)
Immature Granulocytes: 0 %
Lymphocytes Absolute: 2.4 10*3/uL (ref 0.7–3.1)
Lymphs: 34 %
MCH: 28.3 pg (ref 26.6–33.0)
MCHC: 32.2 g/dL (ref 31.5–35.7)
MCV: 88 fL (ref 79–97)
Monocytes Absolute: 0.3 10*3/uL (ref 0.1–0.9)
Monocytes: 5 %
Neutrophils Absolute: 4.1 10*3/uL (ref 1.4–7.0)
Neutrophils: 55 %
Platelets: 315 10*3/uL (ref 150–450)
RBC: 4.52 x10E6/uL (ref 3.77–5.28)
RDW: 12.6 % (ref 11.7–15.4)
WBC: 7.3 10*3/uL (ref 3.4–10.8)

## 2024-04-03 LAB — IGE: IgE (Immunoglobulin E), Serum: 272 [IU]/mL (ref 6–495)

## 2024-04-25 NOTE — Progress Notes (Deleted)
 Susan Johnson, female    DOB: 02-08-1986    MRN: [id number]   Brief patient profile:  89 yobf  never smoker with h/o seasonal nasal allergies spring time worse/ clariton best then changed to xyzal   referred to pulmonary clinic in [place]  03/31/2024 by Jeoffrey Barrio  for cough x Sept 2024    Not prev seen by PCCM    History of Present Illness  03/31/2024  Pulmonary/ 1st office eval/ [facility] / [facility] on prn symbicort  since first of April 2025  Chief Complaint  Patient presents with   Establish Care   Cough  Dyspnea:  was able to do gym as late as Sept 2024 was able to work out unless using albuterol  or symbicort  1st avg 2x weekly Cough: dry/ mostly daytime /ex makes it wose Sleep: bed is flat/ 2pillows/ some flare in am  SABA use: avg 2 puffs a day  02: none Rec Plan A = Automatic = Always=    Symbicort  80 generic or breyna  80  or dulera 100 Take 2 puffs first thing in am and then another 2 puffs about 12 hours later.  Work on inhaler technique:   Plan B = Backup (to supplement plan A, not to replace it) Only use your albuterol -budesonide  inhaler as a rescue medication   Please schedule a follow up office visit in 4 weeks, sooner if needed  with all medications /inhalers/ solutions in hand  03/31/2024 >  Eos 0.4 /  IgE  272  04/29/2024  f/u ov/[place] office/Sherwood Castilla re: cough x sept 2024 ? Cough variant asthma?  maint on *** did *** bring meds  No chief complaint on file.   Dyspnea:  *** Cough: *** Sleeping: ***   resp cc  SABA use: *** 02: ***  Lung cancer screening: ***   No obvious day to day or daytime variability or assoc excess/ purulent sputum or mucus plugs or hemoptysis or cp or chest tightness, subjective wheeze or overt sinus or hb symptoms.    Also denies any obvious fluctuation of symptoms with weather or environmental changes or other aggravating or alleviating factors except as outlined above   No unusual exposure hx or h/o childhood pna/  asthma or knowledge of premature birth.  Current Allergies, Complete Past Medical History, Past Surgical History, Family History, and Social History were reviewed in [employer] record.  ROS  The following are not active complaints unless bolded Hoarseness, sore throat, dysphagia, dental problems, itching, sneezing,  nasal congestion or discharge of excess mucus or purulent secretions, ear ache,   fever, chills, sweats, unintended wt loss or wt gain, classically pleuritic or exertional cp,  orthopnea pnd or arm/hand swelling  or leg swelling, presyncope, palpitations, abdominal pain, anorexia, nausea, vomiting, diarrhea  or change in bowel habits or change in bladder habits, change in stools or change in urine, dysuria, hematuria,  rash, arthralgias, visual complaints, headache, numbness, weakness or ataxia or problems with walking or coordination,  change in mood or  memory.        No outpatient medications have been marked as taking for the 04/29/24 encounter (Appointment) with Marcianna Daily B, MD.            Past Medical History:  Diagnosis Date   Thyroid  disease       Objective:    Wt Readings from Last 3 Encounters:  03/31/24 207 lb (93.9 kg)  03/08/24 211 lb (95.7 kg)  12/24/23 205 lb (93 kg)  Vital signs reviewed  04/29/2024  - Note at rest 02 sats  ***% on ***   General appearance:    ***    Vital signs reviewed  04/29/2024  - Note at rest 02 sats  ***% on ***   General appearance:    ***            Assessment

## 2024-04-29 ENCOUNTER — Ambulatory Visit: Admitting: Internal Medicine

## 2024-05-12 ENCOUNTER — Telehealth: Admitting: Physician Assistant

## 2024-05-12 DIAGNOSIS — J069 Acute upper respiratory infection, unspecified: Secondary | ICD-10-CM | POA: Diagnosis not present

## 2024-05-12 MED ORDER — IPRATROPIUM BROMIDE 0.03 % NA SOLN: 2.0000 | Freq: Two times a day (BID) | NASAL | 0 refills | Status: DC

## 2024-05-12 MED ORDER — BENZONATATE 100 MG PO CAPS: 100.0000 mg | ORAL_CAPSULE | Freq: Three times a day (TID) | ORAL | 0 refills | Status: DC | PRN

## 2024-05-12 NOTE — Progress Notes (Signed)
 I have spent 5 minutes in review of e-visit questionnaire, review and updating patient chart, medical decision making and response to patient.   Piedad Climes, PA-C

## 2024-05-12 NOTE — Progress Notes (Signed)

## 2024-05-16 NOTE — Progress Notes (Unsigned)
 Merrell Abate, female    DOB: [birth year]    MRN: [id number]   Brief patient profile:  63 yobf  never smoker with h/o seasonal nasal allergies spring time worse/ clariton best then changed to xyzal   referred to pulmonary clinic in [place]  03/31/2024 by Yolanda Hence  for cough x Sept 2024    Not prev seen by PCCM    History of Present Illness  03/31/2024  Pulmonary/ 1st office eval/ [facility] Hailey / [facility] on prn symbicort  since first of April 2025  Chief Complaint  Patient presents with   Establish Care   Cough  Dyspnea:  was able to do gym as late as Sept 2024 was able to work out unless using albuterol  or symbicort  1st avg 2x weekly Cough: dry/ mostly daytime /ex makes it wose Sleep: bed is flat/ 2pillows/ some flare in am  SABA use: avg 2 puffs a day  02: none Rec Plan A = Automatic = Always=    Symbicort  80 generic or breyna  80  or dulera 100 Take 2 puffs first thing in am and then another 2 puffs about 12 hours later.  Work on inhaler technique:   Plan B = Backup (to supplement plan A, not to replace it) Only use your albuterol -budesonide  inhaler as a rescue medication   Please schedule a follow up office visit in 4 weeks, sooner if needed  with all medications /inhalers/ solutions in hand  03/31/2024 >  Eos 0.4 /  IgE  272  05/17/2024  f/u ov/[place] office/Tyishia Aune re: cough x sept 2024 ? Cough variant asthma?  maint on dulera 100 2bid  did  bring meds  Chief Complaint  Patient presents with   Follow-up   Asthma    Things are getting better with using Dulera  Dyspnea:  back to working out  Cough: none/ just drippy nose atrovent  0.03/zyrtec  Sleeping: flat bed 2 pillows s noct or am    resp cc  SABA use: hardly ever now than on dulera 100/ not  even with ex  02: none      No obvious day to day or daytime variability or assoc excess/ purulent sputum or mucus plugs or hemoptysis or cp or chest tightness, subjective wheeze or overt  hb symptoms.    Also  denies any obvious fluctuation of symptoms with weather or environmental changes or other aggravating or alleviating factors except as outlined above   No unusual exposure hx or h/o childhood pna/ asthma or knowledge of premature birth.  Current Allergies, Complete Past Medical History, Past Surgical History, Family History, and Social History were reviewed in [employer] record.  ROS  The following are not active complaints unless bolded Hoarseness, sore throat, dysphagia, dental problems, itching, sneezing,  nasal congestion or discharge of excess mucus or purulent secretions, ear ache,   fever, chills, sweats, unintended wt loss or wt gain, classically pleuritic or exertional cp,  orthopnea pnd or arm/hand swelling  or leg swelling, presyncope, palpitations, abdominal pain, anorexia, nausea, vomiting, diarrhea  or change in bowel habits or change in bladder habits, change in stools or change in urine, dysuria, hematuria,  rash, arthralgias, visual complaints, headache, numbness, weakness or ataxia or problems with walking or coordination,  change in mood or  memory.        Current Meds  Medication Sig   Albuterol -Budesonide  (AIRSUPRA ) 90-80 MCG/ACT AERO Inhale 2 puffs into the lungs every 4 (four) hours as needed.   benzonatate  (  TESSALON ) 100 MG capsule Take 1 capsule (100 mg total) by mouth 3 (three) times daily as needed for cough.   budesonide -formoterol  (SYMBICORT ) 80-4.5 MCG/ACT inhaler Inhale 2 puffs into the lungs 2 (two) times daily.   cetirizine (ZYRTEC) 10 MG tablet Take 10 mg by mouth daily.   escitalopram (LEXAPRO) 10 MG tablet Take 10 mg by mouth daily.   ipratropium (ATROVENT ) 0.03 % nasal spray Place 2 sprays into both nostrils every 12 (twelve) hours.   Levonorgestrel  (KYLEENA  IU) by Intrauterine route. Placed 06/12/17   levothyroxine (SYNTHROID) 150 MCG tablet Take 150 mcg by mouth daily.   Magnesium 250 MG CAPS Take by mouth.   mometasone -formoterol   (DULERA) 100-5 MCG/ACT AERO Take 2 puffs first thing in am and then another 2 puffs about 12 hours later.            Past Medical History:  Diagnosis Date   Thyroid  disease       Objective:    Wt Readings from Last 3 Encounters:  05/17/24 211 lb 3.2 oz (95.8 kg)  03/31/24 207 lb (93.9 kg)  03/08/24 211 lb (95.7 kg)      Vital signs reviewed  05/17/2024  - Note at rest 02 sats  98% on RA    General appearance:    amb bf / nasal tone to voice      HEENT : Oropharynx  clear      Nasal turbinates possible L sided polyp   NECK :  without  apparent JVD/ palpable Nodes/TM    LUNGS: no acc muscle use,  Nl contour chest which is clear to A and P bilaterally without cough on insp or exp maneuvers   CV:  RRR  no s3 or murmur or increase in P2, and no edema   ABD:  soft and nontender   MS:  Gait nl   ext warm without deformities Or obvious joint restrictions  calf tenderness, cyanosis or clubbing    SKIN: warm and dry without lesions    NEURO:  alert, approp, nl sensorium with  no motor or cerebellar deficits apparent.     Assessment

## 2024-05-17 ENCOUNTER — Ambulatory Visit (INDEPENDENT_AMBULATORY_CARE_PROVIDER_SITE_OTHER): Admitting: Internal Medicine

## 2024-05-17 ENCOUNTER — Encounter: Payer: Self-pay | Admitting: Internal Medicine

## 2024-05-17 VITALS — BP 112/75 | HR 89 | Ht 68.0 in | Wt 211.2 lb

## 2024-05-17 DIAGNOSIS — J45991 Cough variant asthma: Secondary | ICD-10-CM | POA: Diagnosis not present

## 2024-05-17 NOTE — Patient Instructions (Addendum)
 I emphasized that nasal steroids (flonase) have no immediate benefit in terms of improving symptoms.  To help them reached the target tissue, the patient should use Afrin two puffs every 12 hours applied one min before using the nasal steroids.  Afrin should be stopped after no more than 5 days.  If the symptoms worsen, Afrin can be restarted after 5 days off of therapy to prevent rebound congestion from overuse of Afrin.  I also emphasized that in no way are nasal steroids a concern in terms of addiction.   My office will be contacting you by phone for referral to [facility] allergy  - if you don't hear back from my office within one week please call us  back or notify us  thru MyChart and we'll address it right away.    If you are satisfied with your treatment plan,  let your doctor know and he/she can either refill your medications or you can return here when your prescription runs out.     If in any way you are not 100% satisfied,  please tell us .  If 100% better, tell your friends!  Pulmonary follow up is as needed

## 2024-05-17 NOTE — Assessment & Plan Note (Signed)
 Onset fall 2024 Allergy screen 03/31/2024 >  Eos 0.4 /  IgE  272 - 03/31/2024 rec symbicort  80 (or insurance equivalent ) 2bid and air supra prn  - 05/17/2024  After extensive coaching inhaler device,  effectiveness =    90% hfa but still having lots of rhinitis and ? NP on L > refer to allergy/ f/u pulm prn   Despite poorly controlled rhinitis and possible nasal polyps >>> All goals of chronic asthma control met including optimal function and elimination of symptoms with minimal need for rescue therapy.  Contingencies discussed in full including contacting this office immediately if not controlling the symptoms using the rule of two's.     Pulmonary f/u can be prn once establishes with allergy          Each maintenance medication was reviewed in detail including emphasizing most importantly the difference between maintenance and prns and under what circumstances the prns are to be triggered using an action plan format where appropriate.  Total time for H and P, chart review, counseling, reviewing hfa  device(s) and generating customized AVS unique to this office visit / same day charting = 25 min final summary f/u ov

## 2024-06-17 ENCOUNTER — Ambulatory Visit: Admitting: Family Medicine

## 2024-06-23 ENCOUNTER — Ambulatory Visit: Admitting: Family Medicine

## 2024-07-06 ENCOUNTER — Ambulatory Visit (INDEPENDENT_AMBULATORY_CARE_PROVIDER_SITE_OTHER): Admitting: Family Medicine

## 2024-07-06 ENCOUNTER — Encounter: Payer: Self-pay | Admitting: Family Medicine

## 2024-07-06 VITALS — BP 116/78 | HR 92 | Temp 97.8°F | Ht 68.0 in | Wt 228.4 lb

## 2024-07-06 DIAGNOSIS — G43519 Persistent migraine aura without cerebral infarction, intractable, without status migrainosus: Secondary | ICD-10-CM | POA: Diagnosis not present

## 2024-07-06 DIAGNOSIS — R42 Dizziness and giddiness: Secondary | ICD-10-CM

## 2024-07-06 NOTE — Assessment & Plan Note (Signed)
 Concerning due to new onset, worse with position changes, dizziness, nausea, tinnitus. Will obtain CT head. CMP today. Provided with nurtec sample. Follow up based on results and response PRN if symptoms persist or worsen. Discussed when to seek emergent medical care.

## 2024-07-06 NOTE — Assessment & Plan Note (Signed)
 Negative HINTS and dix hallpike however unable to perform dix hallpike to left due to severity of symptoms. Dizziness and headache worse with position changes.

## 2024-07-06 NOTE — Progress Notes (Signed)
 Subjective:  HPI: Reyanna Baley is a 38 y.o. female presenting on 07/06/2024 for Medical Management of Chronic Issues (Frequent headaches,  ringing in ears, nausea w/ dizziness when changing positions,or  moving head  x 2 weeks )   HPI Patient is in today for 2 weeks frontal dull headache that is worse over her left eye rated 2-4 to a 9. It is continuous daily with maybe one day of relief. Worse with position changes. Associated with nausea, vomiting, and diarrhea on Monday only, persistent dizziness, light and noise sensitivity. Today she experienced dizziness when turning her head and when sitting up described as room spinning. Unrelieved with ibuprofen  and excedrin migraine. Is hydrating with liquid IV. Denies fever, chills, body aches, URI symptoms, vision changes, pregnancy, neck pain, neurologic changes.  No PMH headaches or migraines.   Review of Systems  All other systems reviewed and are negative.   Relevant past medical history reviewed and updated as indicated.   Past Medical History:  Diagnosis Date   Anxiety    Thyroid  disease      Past Surgical History:  Procedure Laterality Date   kyleena  iud     inserted 06-12-17, removed & re-inserted 07-06-21    Allergies and medications reviewed and updated.   Current Outpatient Medications:    Albuterol -Budesonide  (AIRSUPRA ) 90-80 MCG/ACT AERO, Inhale 2 puffs into the lungs every 4 (four) hours as needed., Disp: 10.7 g, Rfl: 11   cetirizine (ZYRTEC) 10 MG tablet, Take 10 mg by mouth daily., Disp: , Rfl:    escitalopram (LEXAPRO) 10 MG tablet, Take 10 mg by mouth daily., Disp: , Rfl:    Levonorgestrel  (KYLEENA  IU), by Intrauterine route. Placed 06/12/17, Disp: , Rfl:    levothyroxine (SYNTHROID) 150 MCG tablet, Take 150 mcg by mouth daily., Disp: , Rfl:    Magnesium 250 MG CAPS, Take by mouth., Disp: , Rfl:    mometasone -formoterol  (DULERA) 100-5 MCG/ACT AERO, Take 2 puffs first thing in am and then another 2 puffs  about 12 hours later., Disp: 1 each, Rfl: 11  No Known Allergies  Objective:   BP 116/78   Pulse 92   Temp 97.8 F (36.6 C)   Ht 5' 8 (1.727 m)   Wt 228 lb 6.4 oz (103.6 kg)   LMP 06/15/2024 (Exact Date)   SpO2 98%   BMI 34.73 kg/m      07/06/2024    3:46 PM 05/17/2024    1:06 PM 03/31/2024    3:31 PM  Vitals with BMI  Height 5' 8 5' 8 5' 8  Weight 228 lbs 6 oz 211 lbs 3 oz 207 lbs  BMI 34.74 32.12 31.48  Systolic 116 112 887  Diastolic 78 75 73  Pulse 92 89 76     Physical Exam Vitals and nursing note reviewed.  Constitutional:      Appearance: Normal appearance. She is normal weight.  HENT:     Head: Normocephalic and atraumatic.     Right Ear: Tympanic membrane, ear canal and external ear normal.     Left Ear: Tympanic membrane, ear canal and external ear normal.  Eyes:     Extraocular Movements: Extraocular movements intact.     Conjunctiva/sclera: Conjunctivae normal.     Pupils: Pupils are equal, round, and reactive to light.  Cardiovascular:     Rate and Rhythm: Normal rate and regular rhythm.     Pulses: Normal pulses.     Heart sounds: Normal heart sounds.  Pulmonary:  Effort: Pulmonary effort is normal.     Breath sounds: Normal breath sounds.  Skin:    General: Skin is warm and dry.  Neurological:     General: No focal deficit present.     Mental Status: She is alert and oriented to person, place, and time. Mental status is at baseline.     GCS: GCS eye subscore is 4. GCS verbal subscore is 5. GCS motor subscore is 6.     Motor: Motor function is intact.     Coordination: Coordination is intact.     Gait: Gait is intact.  Psychiatric:        Mood and Affect: Mood normal.        Behavior: Behavior normal.        Thought Content: Thought content normal.        Judgment: Judgment normal.     Assessment & Plan:  Intractable persistent migraine aura without cerebral infarction and without status migrainosus Assessment & Plan: Concerning  due to new onset, worse with position changes, dizziness, nausea, tinnitus. Will obtain CT head. CMP today. Provided with nurtec sample. Follow up based on results and response PRN if symptoms persist or worsen. Discussed when to seek emergent medical care.   Orders: -     Comprehensive metabolic panel with GFR -     CBC with Differential/Platelet -     CT HEAD WO CONTRAST ( ); Future  Dizziness Assessment & Plan: Negative HINTS and dix hallpike however unable to perform dix hallpike to left due to severity of symptoms. Dizziness and headache worse with position changes.   Orders: -     Comprehensive metabolic panel with GFR -     CBC with Differential/Platelet     Follow up plan: Return if symptoms worsen or fail to improve.  Jeoffrey GORMAN Barrio, FNP

## 2024-07-07 ENCOUNTER — Ambulatory Visit
Admission: RE | Admit: 2024-07-07 | Discharge: 2024-07-07 | Disposition: A | Discharge status: Home / Self Care | Source: Ambulatory Visit | Attending: Family Medicine | Admitting: Family Medicine

## 2024-07-07 DIAGNOSIS — G43519 Persistent migraine aura without cerebral infarction, intractable, without status migrainosus: Secondary | ICD-10-CM

## 2024-07-07 LAB — CBC WITH DIFFERENTIAL/PLATELET
Absolute Lymphocytes: 2504 {cells}/uL (ref 850–3900)
Absolute Monocytes: 402 {cells}/uL (ref 200–950)
Basophils Absolute: 29 {cells}/uL (ref 0–200)
Basophils Relative: 0.4 %
Eosinophils Absolute: 489 {cells}/uL (ref 15–500)
Eosinophils Relative: 6.7 %
HCT: 37.7 % (ref 35.0–45.0)
Hemoglobin: 12.1 g/dL (ref 11.7–15.5)
MCH: 28 pg (ref 27.0–33.0)
MCHC: 32.1 g/dL (ref 32.0–36.0)
MCV: 87.3 fL (ref 80.0–100.0)
MPV: 9.2 fL (ref 7.5–12.5)
Monocytes Relative: 5.5 %
Neutro Abs: 3876 {cells}/uL (ref 1500–7800)
Neutrophils Relative %: 53.1 %
Platelets: 302 Thousand/uL (ref 140–400)
RBC: 4.32 Million/uL (ref 3.80–5.10)
RDW: 13.3 % (ref 11.0–15.0)
Total Lymphocyte: 34.3 %
WBC: 7.3 Thousand/uL (ref 3.8–10.8)

## 2024-07-07 LAB — COMPREHENSIVE METABOLIC PANEL WITH GFR
AG Ratio: 1.7 (calc) (ref 1.0–2.5)
ALT: 32 U/L — ABNORMAL HIGH (ref 6–29)
AST: 22 U/L (ref 10–30)
Albumin: 4.3 g/dL (ref 3.6–5.1)
Alkaline phosphatase (APISO): 38 U/L (ref 31–125)
BUN/Creatinine Ratio: 16 (calc) (ref 6–22)
BUN: 16 mg/dL (ref 7–25)
CO2: 23 mmol/L (ref 20–32)
Calcium: 9.4 mg/dL (ref 8.6–10.2)
Chloride: 106 mmol/L (ref 98–110)
Creat: 0.99 mg/dL — ABNORMAL HIGH (ref 0.50–0.97)
Globulin: 2.6 g/dL (ref 1.9–3.7)
Glucose, Bld: 79 mg/dL (ref 65–99)
Potassium: 4 mmol/L (ref 3.5–5.3)
Sodium: 138 mmol/L (ref 135–146)
Total Bilirubin: 0.7 mg/dL (ref 0.2–1.2)
Total Protein: 6.9 g/dL (ref 6.1–8.1)
eGFR: 76 mL/min/1.73m2 (ref >=60)

## 2024-07-08 ENCOUNTER — Ambulatory Visit: Payer: Self-pay | Admitting: Family Medicine

## 2024-07-08 DIAGNOSIS — H7491 Unspecified disorder of right middle ear and mastoid: Secondary | ICD-10-CM | POA: Insufficient documentation

## 2024-07-08 NOTE — Progress Notes (Signed)
 Spoke with patient regarding results. Symptoms overall improving, will consider Triptans if persistent or return. Going to refer to ENT for mastoid effusion. No pain or swelling near right mastoid or ear

## 2024-07-23 ENCOUNTER — Encounter (INDEPENDENT_AMBULATORY_CARE_PROVIDER_SITE_OTHER): Payer: Self-pay

## 2024-08-31 NOTE — Progress Notes (Deleted)
 38 y.o. G0P0000 Single African American female here for annual exam.    PCP: Kayla Jeoffrey RAMAN, FNP   No LMP recorded. (Menstrual status: IUD).           Sexually active: Yes.    The current method of family planning is IUD-Kyleena  placed 07-06-21 .    Menopausal hormone therapy:  n/a Exercising: {yes no:314532}  {types:19826} Smoker:  no  OB History  Gravida Para Term Preterm AB Living  0 0 0 0 0 0  SAB IAB Ectopic Multiple Live Births  0 0 0 0 0     HEALTH MAINTENANCE: Last 2 paps:  01/04/21 neg HR HPV neg, 02/26/17 neg History of abnormal Pap or positive HPV:  no Mammogram:   n/a Colonoscopy:  n/a Bone Density:  n/a  Result  n/a   Immunization History  Administered Date(s) Administered   HPV 9-valent 01/10/2022, 03/13/2022, 01/20/2023   Hep B, Unspecified 09/05/1999, 10/10/1999, 03/05/2000   Influenza, Seasonal, Injecte, Preservative Fre 09/19/2023   MMR 07/11/1989, 02/16/1993   PFIZER(Purple Top)SARS-COV-2 Vaccination 12/22/2019, 01/12/2020, 09/10/2020   PPD Test 11/07/2021   Tdap 07/17/2010, 08/15/2011, 11/22/2020      reports that she has never smoked. She has never used smokeless tobacco. She reports current alcohol use of about 3.0 standard drinks of alcohol per week. She reports that she does not use drugs.  Past Medical History:  Diagnosis Date   Anxiety    Thyroid  disease     Past Surgical History:  Procedure Laterality Date   kyleena  iud     inserted 06-12-17, removed & re-inserted 07-06-21    Current Outpatient Medications  Medication Sig Dispense Refill   Albuterol -Budesonide  (AIRSUPRA ) 90-80 MCG/ACT AERO Inhale 2 puffs into the lungs every 4 (four) hours as needed. 10.7 g 11   cetirizine (ZYRTEC) 10 MG tablet Take 10 mg by mouth daily.     escitalopram (LEXAPRO) 10 MG tablet Take 10 mg by mouth daily.     Levonorgestrel  (KYLEENA  IU) by Intrauterine route. Placed 06/12/17     levothyroxine (SYNTHROID) 150 MCG tablet Take 150 mcg by mouth daily.      Magnesium 250 MG CAPS Take by mouth.     mometasone -formoterol  (DULERA) 100-5 MCG/ACT AERO Take 2 puffs first thing in am and then another 2 puffs about 12 hours later. 1 each 11   No current facility-administered medications for this visit.    ALLERGIES: Patient has no known allergies.  Family History  Problem Relation Age of Onset   Thyroid  disease Mother    Thyroid  disease Sister    Down syndrome Sister    Diabetes Paternal Aunt    Diabetes Paternal Uncle    Lung cancer Maternal Grandfather    Prostate cancer Paternal Grandfather     Review of Systems  PHYSICAL EXAM:  There were no vitals taken for this visit.    General appearance: alert, cooperative and appears stated age Head: normocephalic, without obvious abnormality, atraumatic Neck: no adenopathy, supple, symmetrical, trachea midline and thyroid  normal to inspection and palpation Lungs: clear to auscultation bilaterally Breasts: normal appearance, no masses or tenderness, No nipple retraction or dimpling, No nipple discharge or bleeding, No axillary adenopathy Heart: regular rate and rhythm Abdomen: soft, non-tender; no masses, no organomegaly Extremities: extremities normal, atraumatic, no cyanosis or edema Skin: skin color, texture, turgor normal. No rashes or lesions Lymph nodes: cervical, supraclavicular, and axillary nodes normal. Neurologic: grossly normal  Pelvic: External genitalia:  no lesions  No abnormal inguinal nodes palpated.              Urethra:  normal appearing urethra with no masses, tenderness or lesions              Bartholins and Skenes: normal                 Vagina: normal appearing vagina with normal color and discharge, no lesions              Cervix: no lesions              Pap taken: {yes no:314532} Bimanual Exam:  Uterus:  normal size, contour, position, consistency, mobility, non-tender              Adnexa: no mass, fullness, tenderness              Rectal exam: {yes  no:314532}.  Confirms.              Anus:  normal sphincter tone, no lesions  Chaperone was present for exam:  {BSCHAPERONE:31226::Emily F, CMA}  ASSESSMENT: Well woman visit with gynecologic exam.  PHQ-2-9: ***  ***  PLAN: Mammogram screening discussed. Self breast awareness reviewed. Pap and HRV collected:  {yes no:314532} Guidelines for Calcium, Vitamin D, regular exercise program including cardiovascular and weight bearing exercise. Medication refills:  *** {LABS (Optional):23779} Follow up:  ***    Additional counseling given.  {yes [id number]. ***  total time was spent for this patient encounter, including preparation, face-to-face counseling with the patient, coordination of care, and documentation of the encounter in addition to doing the well woman visit with gynecologic exam.

## 2024-09-01 ENCOUNTER — Ambulatory Visit: Payer: Self-pay | Admitting: Obstetrics and Gynecology

## 2024-10-17 ENCOUNTER — Other Ambulatory Visit: Payer: Self-pay | Admitting: Internal Medicine

## 2024-10-18 NOTE — Telephone Encounter (Signed)
 Dr. Darlean can you please advise which alternative you prefer for Symbicort    Alternative Requested:THE PRESCRIBED MEDICATION IS NOT COVERED BY INSURANCE. PLEASE CONSIDER CHANGING TO ONE OF THE SUGGESTED COVERED ALTERNATIVES.   All Pharmacy Suggested Alternatives:  budesonide -formoterol  (SYMBICORT ) 80-4.5 MCG/ACT inhaler fluticasone-salmeterol (ADVAIR HFA) 115-21 MCG/ACT inhaler Fluticasone-Salmeterol 232-14 MCG/ACT AEPB

## 2024-10-31 ENCOUNTER — Telehealth: Admitting: Family

## 2024-10-31 DIAGNOSIS — B3731 Acute candidiasis of vulva and vagina: Secondary | ICD-10-CM | POA: Diagnosis not present

## 2024-10-31 MED ORDER — FLUCONAZOLE 150 MG PO TABS: 150.0000 mg | ORAL_TABLET | ORAL | 0 refills | Status: DC | PRN

## 2024-10-31 NOTE — Progress Notes (Signed)

## 2024-11-16 ENCOUNTER — Encounter: Payer: Self-pay | Admitting: Obstetrics and Gynecology

## 2024-11-16 ENCOUNTER — Ambulatory Visit (INDEPENDENT_AMBULATORY_CARE_PROVIDER_SITE_OTHER): Payer: PRIVATE HEALTH INSURANCE | Admitting: Obstetrics and Gynecology

## 2024-11-16 ENCOUNTER — Telehealth: Payer: PRIVATE HEALTH INSURANCE | Admitting: Family Medicine

## 2024-11-16 VITALS — BP 124/84 | HR 77

## 2024-11-16 DIAGNOSIS — N946 Dysmenorrhea, unspecified: Secondary | ICD-10-CM | POA: Diagnosis not present

## 2024-11-16 DIAGNOSIS — N920 Excessive and frequent menstruation with regular cycle: Secondary | ICD-10-CM

## 2024-11-16 DIAGNOSIS — Z975 Presence of (intrauterine) contraceptive device: Secondary | ICD-10-CM

## 2024-11-16 DIAGNOSIS — J069 Acute upper respiratory infection, unspecified: Secondary | ICD-10-CM

## 2024-11-16 DIAGNOSIS — Z86018 Personal history of other benign neoplasm: Secondary | ICD-10-CM | POA: Diagnosis not present

## 2024-11-16 MED ORDER — LEVONORGEST-ETH ESTRAD 91-DAY 0.15-0.03 MG PO TABS: 1.0000 | ORAL_TABLET | Freq: Every day | ORAL | 0 refills | Status: AC

## 2024-11-16 MED ORDER — BENZONATATE 100 MG PO CAPS: 100.0000 mg | ORAL_CAPSULE | Freq: Three times a day (TID) | ORAL | 0 refills | Status: AC | PRN

## 2024-11-16 MED ORDER — IPRATROPIUM BROMIDE 0.03 % NA SOLN: 2.0000 | Freq: Two times a day (BID) | NASAL | 0 refills | Status: AC

## 2024-11-16 NOTE — Progress Notes (Signed)

## 2024-11-16 NOTE — Patient Instructions (Signed)
 Levonorgestrel; Ethinyl Estradiol Tablets What is this medication? LEVONORGESTREL; ETHINYL ESTRADIOL (LEE voh nor jes trel; ETH in il es tra DYE ole) prevents ovulation and pregnancy. It belongs to a group of medications called oral contraceptives. It is a combination of the hormones estrogen and progestin. This medicine may be used for other purposes; ask your health care provider or pharmacist if you have questions. COMMON BRAND NAME(S): [place], [place], [place], [place], [place]ia [place], [place], [place], [place], [place], [place], [place], [place], [place], [place], [place], [address], [place], [place], [place], [place], [place], [place], [place], [place], [place], [place], [place], [place], [place], Levonorgestrel/Ethinyl Estradiol, [place], [place], [place], [place], [place], [place], [place], [place], [place], [place], [place], [place], [place], [place], [place], [place], [place], [place], [place], [place], [place], [place] What should I tell my care team before I take this medication? They need to know if you have or ever had any of these conditions: Blood clots Blood vessel conditions Cancer, such as breast, cervical, endometrial, ovarian, liver, or uterine cancer Diabetes Gallbladder disease Having surgery Heart disease or recent heart attack High blood pressure High cholesterol or triglycerides History of irregular heartbeat or heart valve problems Kidney disease Liver disease Lupus Migraine headaches Protein C deficiency Protein S deficiency Recently had a baby, miscarriage, or abortion Stroke Tobacco use Unusual vaginal bleeding An unusual or allergic reaction to estrogens, progestins, other medications, foods, dyes, or preservatives Pregnant or trying to get pregnant Breastfeeding How should I use this medication? Take this medication by mouth. To reduce nausea, this medication may be taken with food. Follow the directions on the prescription  label. Take this medication at the same time each day and in the order directed on the package. Do not take your medication more often than directed. Contact your care team regarding the use of this medication in children. Special care may be needed. This medication has been used in female children who have started having menstrual periods. A patient package insert for the product will be given with each prescription and refill. Read this sheet carefully each time. The sheet may change frequently. Overdosage: If you think you have taken too much of this medicine contact a poison control center or emergency room at once. NOTE: This medicine is only for you. Do not share this medicine with others. What if I miss a dose? If you miss a dose, refer to the patient package insert for instruction. This medication may not work as well if you miss more than 1 pill. You may need to use back-up contraception. What may interact with this medication? Do not take this medication with the following: Dasabuvir; ombitasvir; paritaprevir; ritonavir Ombitasvir; paritaprevir; ritonavir This medication may also interact with the following: Acetaminophen Aprepitant Ascorbic acid (vitamin C) Atorvastatin Barbiturate medications, such as phenobarbital Bosentan Carbamazepine Caffeine Certain antibiotics, such as rifampin, rifabutin, rifapentine, griseofulvin, penicillins, or tetracyclines Certain antivirals for HIV, such as ritonavir Clofibrate Cyclosporine Dantrolene Doxercalciferol Felbamate Grapefruit juice Hydrocortisone Medications for anxiety or sleeping problems, such as diazepam or temazepam Medications for diabetes, such as pioglitazone Mineral oil Modafinil Mycophenolate Nefazodone Oxcarbazepine Phenytoin Prednisolone Rosuvastatin Selegiline Soy isoflavones supplements St. John's wort Tamoxifen or raloxifene Theophylline Thyroid hormones Topiramate Warfarin This list may not describe  all possible interactions. Give your health care provider a list of all the medicines, herbs, non-prescription drugs, or dietary supplements you use. Also tell them if you smoke, drink alcohol, or use illegal drugs. Some items may interact with your medicine. What should I watch for while using this medication? Visit your care team  for regular checks on your progress. You will need a regular breast and pelvic exam and Pap smear while on this medication. Use another form of contraception, such as a condom, during the first cycle of this medication. If you may be pregnant, stop taking this medication right away and contact your care team. If you are taking this medication for hormone related problems, it may take several cycles of use to see improvement in your condition. Talk to your care team if you use tobacco products. Changes to your treatment plan may be needed. Tobacco increases the risk of getting a blood clot or having a stroke while you are taking this medication. This risk is higher if you are 35 years or older. This medication can make your body retain fluid, making your fingers, hands, or ankles swell. Your blood pressure can go up. Contact your care team if you feel you are retaining fluid. This medication can make you more sensitive to the sun. Keep out of the sun. If you cannot avoid being in the sun, wear protective clothing and sunscreen. Do not use sun lamps, tanning beds, or tanning booths. If you wear contact lenses and notice visual changes, or if the lenses begin to feel uncomfortable, consult your eye care specialist. This medication can cause tooth and gum problems. Tenderness, swelling, or minor bleeding of the gums may occur. Brushing and flossing your teeth regularly may reduce the risk of side effects. Visit your dentist on a regular basis. Tell your dentist about any medications you are taking. If you are going to have elective surgery, you may need to stop taking this  medication before the surgery. Consult your care team for advice. Using this medication does not protect you or your partner against HIV or other sexually transmitted infections (STIs). What side effects may I notice from receiving this medication? Side effects that you should report to your care team as soon as possible: Allergic reactions--skin rash, itching, hives, swelling of the face, lips, tongue, or throat Blood clot--pain, swelling, or warmth in the leg, shortness of breath, chest pain Gallbladder problems--severe stomach pain, nausea, vomiting, fever Increase in blood pressure Liver injury--right upper belly pain, loss of appetite, nausea, light-colored stool, dark yellow or brown urine, yellowing skin or eyes, unusual weakness or fatigue New or worsening migraines or headaches Stroke--sudden numbness or weakness of the face, arm, or leg, trouble speaking, confusion, trouble walking, loss of balance or coordination, dizziness, severe headache, change in vision Unusual vaginal discharge, itching, or odor Worsening mood, feelings of depression Side effects that usually do not require medical attention (report these to your care team if they continue or are bothersome): Breast pain or tenderness Dark patches of skin on the face or other sun-exposed areas Irregular menstrual cycles or spotting Nausea Weight gain This list may not describe all possible side effects. Call your doctor for medical advice about side effects. You may report side effects to FDA at 1-800-FDA-1088. Where should I keep my medication? Keep out of the reach of children and pets. Store at room temperature between 15 and 30 degrees C (59 and 86 degrees F). Throw away any unused medication after the expiration date. NOTE: This sheet is a summary. It may not cover all possible information. If you have questions about this medicine, talk to your doctor, pharmacist, or health care provider.  2024 Elsevier/Gold Standard  (2022-09-03 00:00:00)

## 2024-11-16 NOTE — Progress Notes (Signed)
 GYNECOLOGY  VISIT   HPI: 38 y.o.   Single  African American female   [id number] with Patient's last menstrual period was 11/05/2024 (exact date).   here for: Increased pain with periods for the last several months.  Does not feel like the Kyleena  is helping. Feels like her periods are worse, missing work, bleeding through clothes, taking over the counter medicine is not helping with pain.    Monthly period.  Last 5 - 7 days.  Using cup.  In past, changed every 8 hours.  Now emptying 3 - 4 times in 6 - 8 hour.   Having large clots.  Rarely has bleeding outside of her menses. Increased pain day 2 - 3.  Takes Norco of her partner to control the pain.   Having headaches with her periods.   Over the last 8 months, cycles are lasting longer and heavier.   Not having pain outside of her cycle time.     Likes the convenience of using IUD for pregnancy prevention.   Same partner for 2 years.    She has a known 19 mm fibroid on pelvic US  12/26/27.   Took oral contraceptives in the past.   No problems or complications with us .  Denies smoking and vaping.  Denies HTN.  Has migraine without aura.   Denies liver or breast disease.  No personal or family history of thromboembolic events.  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       GYNECOLOGIC HISTORY: Patient's last menstrual period was 11/05/2024 (exact date). Contraception:  IUD- Kyleena  placed 07-06-21  Menopausal hormone therapy:  n/a Last 2 paps:  01/04/21 neg HR HPV neg, 02/26/17 neg  History of abnormal Pap or positive HPV:  no Mammogram:  n/a        OB History     Gravida  0   Para  0   Term  0   Preterm  0   AB  0   Living  0      SAB  0   IAB  0   Ectopic  0   Multiple  0   Live Births  0              Patient Active Problem List   Diagnosis Date Noted   Disorder of right mastoid 07/08/2024   Intractable persistent migraine aura without cerebral infarction and without status migrainosus  07/06/2024   Dizziness 07/06/2024   Cough variant asthma 03/31/2024   Shortness of breath 03/08/2024   Viral URI with cough 12/24/2023   URI with cough and congestion 10/02/2023   Physical exam, annual 08/11/2023   Postablative hypothyroidism 08/11/2023   COVID-19 12/07/2021   Cough 11/11/2020   Hyperthyroidism 08/26/2017   IUD (intrauterine device) in place 07/24/2017   Fibroids, intramural 07/24/2017   Obesity (BMI 30.0-34.9) 07/15/2017   Graves disease 08/03/2009    Past Medical History:  Diagnosis Date   Anxiety    Migraine headache without aura    Thyroid  disease     Past Surgical History:  Procedure Laterality Date   kyleena  iud     inserted 06-12-17, removed & re-inserted 07-06-21    Current Outpatient Medications  Medication Sig Dispense Refill   Albuterol -Budesonide  (AIRSUPRA ) 90-80 MCG/ACT AERO Inhale 2 puffs into the lungs every 4 (four) hours as needed. 10.7 g 11   benzonatate  (TESSALON ) 100 MG capsule Take 1-2 capsules (100-200 mg total) by mouth 3 (three) times daily as needed for cough. 30 capsule  0   budesonide -formoterol  (SYMBICORT ) 80-4.5 MCG/ACT inhaler Take 2 puffs first thing in am and then another 2 puffs about 12 hours later. 10.2 g 11   cetirizine (ZYRTEC) 10 MG tablet Take 10 mg by mouth daily.     escitalopram (LEXAPRO) 10 MG tablet Take 10 mg by mouth daily.     ipratropium (ATROVENT ) 0.03 % nasal spray Place 2 sprays into both nostrils every 12 (twelve) hours. 30 mL 0   Levonorgestrel  (KYLEENA  IU) by Intrauterine route. Placed 06/12/17     levothyroxine (SYNTHROID) 137 MCG tablet Take 137 mcg by mouth daily.     Magnesium 250 MG CAPS Take by mouth.     No current facility-administered medications for this visit.     ALLERGIES: Patient has no known allergies.  Family History  Problem Relation Age of Onset   Thyroid  disease Mother    Thyroid  disease Sister    Down syndrome Sister    Diabetes Paternal Aunt    Diabetes Paternal Uncle    Lung  cancer Maternal Grandfather    Prostate cancer Paternal Grandfather     Social History   Socioeconomic History   Marital status: Single    Spouse name: Not on file   Number of children: Not on file   Years of education: Not on file   Highest education level: Bachelor's degree (e.g., BA, AB, BS)  Occupational History   Not on file  Tobacco Use   Smoking status: Never   Smokeless tobacco: Never  Vaping Use   Vaping status: Never Used  Substance and Sexual Activity   Alcohol use: Yes    Alcohol/week: 3.0 standard drinks of alcohol    Types: 3 Glasses of wine per week   Drug use: Never   Sexual activity: Yes    Partners: Male    Birth control/protection: I.U.D.    Comment: Kyleena  placed 07-06-21  Other Topics Concern   Not on file  Social History Narrative   Not on file   Social Drivers of Health   Tobacco Use: Low Risk (11/16/2024)   Patient History    Smoking Tobacco Use: Never    Smokeless Tobacco Use: Never    Passive Exposure: Not on file  Financial Resource Strain: Low Risk (07/05/2024)   Overall Financial Resource Strain (CARDIA)    Difficulty of Paying Living Expenses: Not hard at all  Food Insecurity: No Food Insecurity (07/05/2024)   Epic    Worried About Radiation Protection Practitioner of Food in the Last Year: Never true    Ran Out of Food in the Last Year: Never true  Transportation Needs: No Transportation Needs (07/05/2024)   Epic    Lack of Transportation (Medical): No    Lack of Transportation (Non-Medical): No  Physical Activity: Sufficiently Active (07/05/2024)   Exercise Vital Sign    Days of Exercise per Week: 3 days    Minutes of Exercise per Session: 60 min  Stress: No Stress Concern Present (07/05/2024)   [employer] of Occupational Health - Occupational Stress Questionnaire    Feeling of Stress: Only a little  Social Connections: Socially Integrated (07/05/2024)   Social Connection and Isolation Panel    Frequency of Communication with Friends and Family: More  than three times a week    Frequency of Social Gatherings with Friends and Family: Once a week    Attends Religious Services: More than 4 times per year    Active Member of Clubs or Organizations: Yes    Attends  Club or Organization Meetings: More than 4 times per year    Marital Status: Living with partner  Intimate Partner Violence: Not At Risk (03/25/2023)   Received from [facility]   HITS    Over the last 12 months how often did your partner physically hurt you?: Never    Over the last 12 months how often did your partner insult you or talk down to you?: Never    Over the last 12 months how often did your partner threaten you with physical harm?: Never    Over the last 12 months how often did your partner scream or curse at you?: Never  Depression (PHQ2-9): Low Risk (03/08/2024)   Depression (PHQ2-9)    PHQ-2 Score: 0  Alcohol Screen: Low Risk (07/05/2024)   Alcohol Screen    Last Alcohol Screening Score (AUDIT): 3  Housing: Low Risk (07/05/2024)   Epic    Unable to Pay for Housing in the Last Year: No    Number of Times Moved in the Last Year: 0    Homeless in the Last Year: No  Utilities: Not At Risk (04/02/2024)   Received from [facility] Utilities    Threatened with loss of utilities: No  Health Literacy: Not on file    Review of Systems  All other systems reviewed and are negative.   PHYSICAL EXAMINATION:   BP 124/84 (BP Location: Left Arm, Patient Position: Sitting)   Pulse 77   LMP 11/05/2024 (Exact Date)   SpO2 99%     General appearance: alert, cooperative and appears stated age   Pelvic: External genitalia:  no lesions              Urethra:  normal appearing urethra with no masses, tenderness or lesions              Bartholins and Skenes: normal                 Vagina: normal appearing vagina with normal color and discharge, no lesions              Cervix: no lesions.  IUD strings noted.  Very light brown blood noted.                 Bimanual Exam:   Uterus:  normal size, contour, position, consistency, mobility, non-tender              Adnexa: no mass, fullness, tenderness        Chaperone was present for exam:  Kari HERO, CMA  ASSESSMENT:  Dysmenorrhea.  Menorrhagia with regular menses.  Kyleena  IUD.  Hx fibroid.  Menstrual headaches.   PLAN:  Fibroids, endometriosis, may be potential causes for her symptoms.  Her IUD may not be adequate to control her symptoms.  Return for pelvic US . Will add Seasonale to her regimen, and she will start with her next cycle.  Instructed in use.  We reviewed risk of stroke, PE, MI, and DVT and warning signs associated with these. Declines STD screening today.  33 min  total time was spent for this patient encounter, including preparation, face-to-face counseling with the patient, coordination of care, and documentation of the encounter.

## 2024-11-21 ENCOUNTER — Ambulatory Visit (HOSPITAL_COMMUNITY)
Admission: RE | Admit: 2024-11-21 | Discharge: 2024-11-21 | Disposition: A | Discharge status: Home / Self Care | Payer: PRIVATE HEALTH INSURANCE | Source: Ambulatory Visit | Attending: Family Medicine | Admitting: Family Medicine

## 2024-11-21 ENCOUNTER — Ambulatory Visit (HOSPITAL_COMMUNITY): Payer: Self-pay | Admitting: Family Medicine

## 2024-11-21 ENCOUNTER — Ambulatory Visit (HOSPITAL_COMMUNITY): Payer: PRIVATE HEALTH INSURANCE

## 2024-11-21 VITALS — BP 126/78 | HR 91 | Temp 97.9°F | Resp 17

## 2024-11-21 DIAGNOSIS — M546 Pain in thoracic spine: Secondary | ICD-10-CM

## 2024-11-21 LAB — POCT URINE DIPSTICK
Glucose, UA: NEGATIVE mg/dL
Ketones, POC UA: NEGATIVE mg/dL
Leukocytes, UA: NEGATIVE
Nitrite, UA: NEGATIVE
Protein Ur, POC: NEGATIVE mg/dL
Spec Grav, UA: 1.025
Urobilinogen, UA: 0.2 U/dL
pH, UA: 6

## 2024-11-21 MED ORDER — KETOROLAC TROMETHAMINE 30 MG/ML IJ SOLN
30.0000 mg | Freq: Once | INTRAMUSCULAR | Status: AC
  Administered 2024-11-21: 30 mg via INTRAMUSCULAR

## 2024-11-21 MED ORDER — OXYCODONE-ACETAMINOPHEN 5-325 MG PO TABS: 1.0000 | ORAL_TABLET | Freq: Two times a day (BID) | ORAL | 0 refills | Status: AC | PRN

## 2024-11-21 MED ORDER — KETOROLAC TROMETHAMINE 30 MG/ML IJ SOLN
INTRAMUSCULAR | Status: AC
  Filled 2024-11-21: qty 1

## 2024-11-21 MED ORDER — BACLOFEN 10 MG PO TABS: 10.0000 mg | ORAL_TABLET | Freq: Two times a day (BID) | ORAL | 0 refills | Status: AC | PRN

## 2024-11-21 NOTE — Telephone Encounter (Signed)
 She confirmed her name and DOB. Xray result discussed. She was appreciative of the call.

## 2024-11-21 NOTE — ED Provider Notes (Signed)
 " [facility]    CSN: [id number] Arrival date & time: 11/21/24  1335      History   Chief Complaint Chief Complaint  Patient presents with   Back Pain    Entered by patient    HPI Susan Johnson is a 38 y.o. female.   The history is provided by the patient. No language interpreter was used.  Back Pain Location:  Lumbar spine Quality: Sharp pain. Pain severity:  Moderate (7/10 in severity) Timing:  Constant Progression:  Unchanged Chronicity:  New Context: not lifting heavy objects, not physical stress and not recent injury   Context comment:  Started while staying outside with her dog and all of a sudden she started experiencing back pain wrapped around her belly Relieved by:  Nothing Worsened by:  Movement Ineffective treatments:  None tried Associated symptoms: no bladder incontinence, no bowel incontinence, no dysuria, no fever, no tingling and no weakness     Past Medical History:  Diagnosis Date   Anxiety    Migraine headache without aura    Thyroid  disease     Patient Active Problem List   Diagnosis Date Noted   Disorder of right mastoid 07/08/2024   Intractable persistent migraine aura without cerebral infarction and without status migrainosus 07/06/2024   Dizziness 07/06/2024   Cough variant asthma 03/31/2024   Shortness of breath 03/08/2024   Viral URI with cough 12/24/2023   URI with cough and congestion 10/02/2023   Physical exam, annual 08/11/2023   Postablative hypothyroidism 08/11/2023   COVID-19 12/07/2021   Cough 11/11/2020   Hyperthyroidism 08/26/2017   IUD (intrauterine device) in place 07/24/2017   Fibroids, intramural 07/24/2017   Obesity (BMI 30.0-34.9) 07/15/2017   Graves disease 08/03/2009    Past Surgical History:  Procedure Laterality Date   kyleena  iud     inserted 06-12-17, removed & re-inserted 07-06-21    OB History     Gravida  0   Para  0   Term  0   Preterm  0   AB  0   Living  0       SAB  0   IAB  0   Ectopic  0   Multiple  0   Live Births  0            Home Medications    Prior to Admission medications  Medication Sig Start Date End Date Taking? Authorizing Provider  Albuterol -Budesonide  (AIRSUPRA ) 90-80 MCG/ACT AERO Inhale 2 puffs into the lungs every 4 (four) hours as needed. 03/31/24   Darlean Ozell NOVAK, MD  benzonatate  (TESSALON ) 100 MG capsule Take 1-2 capsules (100-200 mg total) by mouth 3 (three) times daily as needed for cough. 11/16/24   Moishe Chiquita HERO, NP  budesonide -formoterol  (SYMBICORT ) 80-4.5 MCG/ACT inhaler Take 2 puffs first thing in am and then another 2 puffs about 12 hours later. 10/18/24   Darlean Ozell NOVAK, MD  cetirizine (ZYRTEC) 10 MG tablet Take 10 mg by mouth daily.    [provider]  escitalopram (LEXAPRO) 10 MG tablet Take 10 mg by mouth daily.    [provider]  ipratropium (ATROVENT ) 0.03 % nasal spray Place 2 sprays into both nostrils every 12 (twelve) hours. 11/16/24   Moishe Chiquita HERO, NP  Levonorgestrel  (KYLEENA  IU) by Intrauterine route. Placed 06/12/17    [provider]  levonorgestrel -ethinyl estradiol (SEASONALE) 0.15-0.03 MG tablet Take 1 tablet by mouth daily. 11/16/24   Cathlyn JAYSON Nikki Bobie FORBES, MD  levothyroxine (SYNTHROID) 137 MCG tablet Take 137 mcg by mouth daily.    [provider]  Magnesium 250 MG CAPS Take by mouth.    [provider]    Family History Family History  Problem Relation Age of Onset   Thyroid  disease Mother    Thyroid  disease Sister    Down syndrome Sister    Diabetes Paternal Aunt    Diabetes Paternal Uncle    Lung cancer Maternal Grandfather    Prostate cancer Paternal Grandfather     Social History Social History[1]   Allergies   Patient has no known allergies.   Review of Systems Review of Systems  Constitutional:  Negative for fever.  Gastrointestinal:  Negative for bowel incontinence.  Genitourinary:  Negative for bladder  incontinence and dysuria.  Musculoskeletal:  Positive for back pain.  Neurological:  Negative for tingling and weakness.     Physical Exam Triage Vital Signs ED Triage Vitals  Encounter Vitals Group     BP 11/21/24 1356 126/78     Girls Systolic BP Percentile --      Girls Diastolic BP Percentile --      Boys Systolic BP Percentile --      Boys Diastolic BP Percentile --      Pulse Rate 11/21/24 1356 91     Resp 11/21/24 1356 17     Temp 11/21/24 1356 97.9 F (36.6 C)     Temp Source 11/21/24 1356 Oral     SpO2 11/21/24 1356 98 %     Weight --      Height --      Head Circumference --      Peak Flow --      Pain Score 11/21/24 1354 7     Pain Loc --      Pain Education --      Exclude from Growth Chart --    No data found.  Updated Vital Signs BP 126/78 (BP Location: Right Arm)   Pulse 91   Temp 97.9 F (36.6 C) (Oral)   Resp 17   LMP 11/05/2024 (Exact Date)   SpO2 98%   LMP: 1.5 weeks ago, ended on 11/11/24  Visual Acuity Right Eye Distance:   Left Eye Distance:   Bilateral Distance:    Right Eye Near:   Left Eye Near:    Bilateral Near:     Physical Exam Vitals and nursing note reviewed.  Constitutional:      Appearance: She is not ill-appearing or toxic-appearing.     Comments: Mild distress due to pain  Cardiovascular:     Rate and Rhythm: Normal rate and regular rhythm.     Heart sounds: Normal heart sounds. No murmur heard. Pulmonary:     Effort: Pulmonary effort is normal. No respiratory distress.     Breath sounds: Normal breath sounds. No wheezing or rhonchi.  Musculoskeletal:     Cervical back: Normal.     Thoracic back: Tenderness present. No swelling or deformity. Decreased range of motion.     Lumbar back: Normal.     Comments: Patient crying with back exam      UC Treatments / Results  Labs (all labs ordered are listed, but only abnormal results are displayed) Labs Reviewed  POCT URINE DIPSTICK - Abnormal; Notable for the  following components:      Result Value   Bilirubin, UA small (*)    Blood, UA trace-intact (*)    All other components within normal  limits    EKG   Radiology No results found.  Procedures Procedures (including critical care time)  Medications Ordered in UC Medications  ketorolac  (TORADOL ) 30 MG/ML injection 30 mg (30 mg Intramuscular Given 11/21/24 1432)    Initial Impression / Assessment and Plan / UC Course  I have reviewed the triage vital signs and the nursing notes.  Pertinent labs & imaging results that were available during my care of the patient were reviewed by me and considered in my medical decision making (see chart for details).  Clinical Course as of 11/21/24 1459  Sun Nov 21, 2024  1459 Thoracic back pain Likely muscle sprain vs kidney stone Toradol  30 mg IM given during this visit Back X-ray negative for acute findings UA negative for infection, but shows trace intact blood, which could be in the setting of a renal stone Baclofen  is prescribed prn for muscle spasm Oxycodone  prn pain Use warm compression on her back and rest at home Keep well hydrated to flush the kidneys Return to us  or PCP soon if there is no improvement She agreed with the plan [KE]    Clinical Course User Index [KE] Anders Otto DASEN, MD     Final Clinical Impressions(s) / UC Diagnoses   Final diagnoses:  Acute midline thoracic back pain   Discharge Instructions   None    ED Prescriptions   None    PDMP not reviewed this encounter.     [1]  Social History Tobacco Use   Smoking status: Never   Smokeless tobacco: Never  Vaping Use   Vaping status: Never Used  Substance Use Topics   Alcohol use: Yes    Alcohol/week: 3.0 standard drinks of alcohol    Types: 3 Glasses of wine per week   Drug use: Never     Anders Otto DASEN, MD 11/21/24 1500  "

## 2024-11-21 NOTE — Discharge Instructions (Addendum)
 It was nice seeing you today. I am sorry about your back pain. I suspect this is likely a muscle sprain. However, due to the severity, we obtained X-rays and urine tests. Your urine shows trace blood, which could be due to a renal stone or other benign causes. I will call you with the X-ray test results. In the meantime, I sent pain medication and a muscle relaxant called Baclofen  to your pharmacy. Do not use Baclofen  while driving or operating machinery, as it can cause drowsiness. Drink plenty of water to flush your kidneys. See us  or your PCP soon if there is no improvement.

## 2024-11-21 NOTE — ED Triage Notes (Signed)
 Pt has c/o mid back pain that started today. Pt states she was outside with her dogs and the pain just came suddenly. Denies injury to back.

## 2024-11-22 ENCOUNTER — Telehealth: Payer: PRIVATE HEALTH INSURANCE | Admitting: Physician Assistant

## 2024-11-22 ENCOUNTER — Encounter: Payer: Self-pay | Admitting: Family Medicine

## 2024-11-22 DIAGNOSIS — M549 Dorsalgia, unspecified: Secondary | ICD-10-CM

## 2024-11-22 MED ORDER — METHYLPREDNISOLONE 4 MG PO TBPK: ORAL_TABLET | ORAL | 0 refills | Status: AC

## 2024-11-22 NOTE — Progress Notes (Signed)
 We are sorry that you are not feeling well.  Here is how we plan to help!  Based on what you have shared with me it, appears you may be experiencing acute back pain.   Acute back pain is defined as musculoskeletal pain that can resolve in 1-3 weeks with conservative treatment.  I have prescribed Medrol  dose pack, a steroid taper pack. Some patients experience stomach irritation or in increased heartburn with anti-inflammatory drugs.  Please keep in mind that muscle relaxer's can cause fatigue and should not be taken while at work or driving.  Back pain is very common.  The pain often gets better over time.  The cause of back pain is usually not dangerous.  Most people can learn to manage their back pain on their own.  Home Care Stay active.  Start with short walks on flat ground if you can.  Try to walk farther each day. Do not sit, drive or stand in one place for more than 30 minutes.  Do not stay in bed. Do not fully avoid exercise or work.  Activity can help your back heal faster. Be careful when you bend or lift an object.  Bend at your knees, keep the object close to you, and do not twist. Sleep on a firm mattress.  Lie on your side, and bend your knees.  If you lie on your back, put a pillow under your knees. Only take medicines as told by your doctor. Put ice on the injured area. Put ice in a plastic bag Place a towel between your skin and the bag Leave the ice on for 15-20 minutes, 3-4 times a day for the first 2-3 days.  After that, you can switch between ice and heat packs. Ask your doctor about back exercises or massage.   Get Help Right Way If: Your pain does not go away with rest and treatments given today. Your pain does not go away within 1 week. You have new problems. You do not feel well. The pain spreads into your legs. You cannot control when you poop (bowel movement) or pee (urinate). You feel sick to your stomach (nauseous) or throw up (vomit). You have belly  (abdominal) pain. You feel like you may pass out (faint). If you develop a fever.  Make Sure you: Understand these instructions. Continue to monitor your condition for any changes. Will get help right away if you are not doing well or get worse.  Your e-visit answers were reviewed by a board certified advanced clinical practitioner to complete your personal care plan.  Depending on the condition, your plan could have included both over the counter or prescription medications.  If there is a problem, please reply once you have received a response from your provider.  Your safety is important to us .  If you have drug allergies, check your prescription carefully.    You can use MyChart to ask questions about todays visit, request a non-urgent call back, or ask for a work or school excuse for 24 hours related to this e-Visit. If it has been greater than 24 hours you will need to follow up with your provider or enter a new e-Visit to address those concerns.  You will get an e-mail in the next two days asking about your experience.  I hope that your e-visit has been valuable and will speed your recovery. Thank you for using e-visits.   I have spent 5 minutes in review of e-visit questionnaire, review and updating patient  chart, medical decision making and response to patient.   Delon CHRISTELLA Dickinson, PA-C

## 2024-11-23 ENCOUNTER — Ambulatory Visit: Payer: PRIVATE HEALTH INSURANCE | Admitting: Family Medicine

## 2024-11-24 ENCOUNTER — Ambulatory Visit: Payer: PRIVATE HEALTH INSURANCE | Admitting: Family Medicine

## 2024-11-24 ENCOUNTER — Encounter: Payer: Self-pay | Admitting: Family Medicine

## 2024-11-24 VITALS — BP 122/82 | HR 80 | Ht 68.0 in | Wt 209.4 lb

## 2024-11-24 DIAGNOSIS — M546 Pain in thoracic spine: Secondary | ICD-10-CM | POA: Diagnosis not present

## 2024-11-24 DIAGNOSIS — M62830 Muscle spasm of back: Secondary | ICD-10-CM

## 2024-11-24 MED ORDER — MELOXICAM 15 MG PO TABS: 15.0000 mg | ORAL_TABLET | Freq: Every day | ORAL | 0 refills | Status: AC

## 2024-11-24 MED ORDER — METHOCARBAMOL 500 MG PO TABS: 500.0000 mg | ORAL_TABLET | Freq: Three times a day (TID) | ORAL | 1 refills | Status: AC | PRN

## 2024-11-24 NOTE — Progress Notes (Signed)
 "  Patient Office Visit  Assessment & Plan:  Back muscle spasm -     Methocarbamol ; Take 1 tablet (500 mg total) by mouth every 8 (eight) hours as needed.  Dispense: 30 tablet; Refill: 1  Acute bilateral thoracic back pain -     Methocarbamol ; Take 1 tablet (500 mg total) by mouth every 8 (eight) hours as needed.  Dispense: 30 tablet; Refill: 1 -     Meloxicam ; Take 1 tablet (15 mg total) by mouth daily.  Dispense: 30 tablet; Refill: 0   Assessment and Plan    Acute thoracic back pain with muscle spasm Acute thoracic back pain with muscle spasm, initially severe, now improved. Pain mid-thoracic, radiating to ribs, exacerbated by twisting and prolonged standing, relieved by ibuprofen  and heating pad. Differential includes muscle strain, unlikely bone or nerve involvement. Improvement noted with current management. - Prescribed meloxicam  for anti-inflammatory effect. - Prescribed Robaxin  for daytime muscle relaxation. - Continue baclofen  at night. - Advised use of heating pad and proper pillow positioning. - Encouraged wearing supportive shoes during work hours.          No follow-ups on file.   Subjective:    Patient ID: Susan Johnson, female    DOB: [birth year]  Age: 38 y.o. MRN: [id number]  Chief Complaint  Patient presents with   Back Pain    Pain states that she had a pain in her spine that went all the way to her ribs on Sunday. Pt states that she felt a spasm in her back.     Back Pain   Discussed the use of AI scribe software for clinical note transcription with the patient, who gave verbal consent to proceed.  History of Present Illness        History of Present Illness Susan Johnson is a 38 year old female who presents with acute mid-back pain radiating to the ribs. patient was seen at Urgent Care this past Sunday  She experienced a sudden onset of mid-back pain radiating to her ribs, described as feeling like her spine had 'split open' and her  ribs 'went in'. The pain began while she was standing in her office, preparing to take her dogs inside. Initially, the pain was rated as 10 out of 10, but it has since improved to 1-2 out of 10 while sitting. She does not know what triggered this. 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 Discomfort is noted when bending forward and with twisting motions. The pain does not worsen with deep breaths and is not tender to touch. No numbness, tingling, or burning sensations are present.  She has been taking ibuprofen  400 mg during the day, which helps manage the pain. At night, she uses a heating pad and positions her pillows to aid sleep, which she is able to do without interruption. She was prescribed baclofen  at urgent care this past Sunday, which she takes at night due to sensitivity to medications. Patient cannot take this medication during the day  She received a Toradol  injection at urgent care, which did not alleviate her pain. An x-ray was performed which was negative though she did not experience any trauma. 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 She alternates between wearing [employer] and [employer] for work.  Physical Exam CHEST: Lungs clear to auscultation. MUSCULOSKELETAL: Tenderness on the sides of the spine, uncomfortable bending forward, pain with lateral bending and twisting motion.  Results Radiology Thoracic spine x-ray (11/21/2024): No evidence of trauma or bony abnormality  Assessment and Plan Acute thoracic back pain with muscle spasm Acute thoracic back pain with muscle spasm, initially severe, now improved. Pain mid-thoracic, radiating to ribs, exacerbated by  twisting and prolonged standing, relieved by ibuprofen  and heating pad. Differential includes muscle strain, unlikely bone or nerve involvement. Improvement noted with current management. - Prescribed meloxicam  for anti-inflammatory effect. - Prescribed Robaxin  for daytime muscle relaxation. - Continue baclofen  at night. - Advised use of heating pad and proper pillow positioning. - Encouraged wearing supportive shoes during work hours. -note given to allow her to return to massage therapy in the future   The ASCVD Risk score (Arnett DK, et al., 2019) failed to calculate for the following reasons:   The 2019 ASCVD risk score is only valid for ages 44 to 76  Past Medical History:  Diagnosis Date   Anxiety    Migraine headache without aura    Thyroid  disease    Past Surgical History:  Procedure Laterality Date   kyleena  iud     inserted 06-12-17, removed & re-inserted 07-06-21   Social History[1] Family History  Problem Relation Age of Onset   Thyroid  disease Mother    Thyroid  disease Sister    Down syndrome Sister    Diabetes Paternal Aunt    Diabetes Paternal Uncle    Lung cancer Maternal Grandfather    Prostate cancer Paternal Grandfather    Allergies[2]  Review of Systems  Musculoskeletal:  Positive for back pain.      Objective:    BP 122/82   Pulse 80   Ht 5' 8 (1.727 m)   Wt 209 lb 6 oz (95 kg)   LMP 11/05/2024 (Exact Date)   SpO2 98%   BMI 31.84 kg/m  BP Readings from Last 3 Encounters:  11/24/24 122/82  11/21/24 126/78  11/16/24 124/84   Wt Readings from Last 3 Encounters:  11/24/24 209 lb 6 oz (95 kg)  07/06/24 228 lb 6.4 oz (103.6 kg)  05/17/24 211 lb 3.2 oz (95.8 kg)    Physical Exam Vitals and nursing note reviewed.  Constitutional:      General: She is not in acute distress.    Appearance: Normal appearance.  HENT:     Head: Normocephalic.     Right Ear: Tympanic membrane, ear canal and external ear normal.     Left Ear: Tympanic  membrane, ear canal and external ear normal.  Eyes:     Extraocular Movements: Extraocular movements intact.     Conjunctiva/sclera: Conjunctivae normal.     Pupils: Pupils are equal, round, and reactive to light.  Cardiovascular:     Rate and Rhythm: Normal rate and regular rhythm.     Heart sounds: Normal heart sounds.  Pulmonary:     Effort: Pulmonary effort is normal.     Breath sounds: Normal breath sounds. No wheezing or rhonchi.  Musculoskeletal:     Thoracic back: Spasms, tenderness and bony tenderness present. No scoliosis.     Right lower leg: No edema.     Left lower leg: No edema.     Comments: Can do forward flexion and extension but does have discomfort with lateral rotation from  the waist.  No rash noted.  Patient is able to get in exam table without difficulty.  Skin:    Findings: No rash.  Neurological:     General: No focal deficit present.     Mental Status: She is alert and oriented to person, place, and time.     Gait: Gait normal.  Psychiatric:        Mood and Affect: Mood normal.        Behavior: Behavior normal.        Thought Content: Thought content normal.        Judgment: Judgment normal.      No results found for any visits on 11/24/24.          [1]  Social History Tobacco Use   Smoking status: Never   Smokeless tobacco: Never  Vaping Use   Vaping status: Never Used  Substance Use Topics   Alcohol use: Yes    Alcohol/week: 3.0 standard drinks of alcohol    Types: 3 Glasses of wine per week   Drug use: Never  [2] No Known Allergies  "

## 2024-12-08 ENCOUNTER — Ambulatory Visit (INDEPENDENT_AMBULATORY_CARE_PROVIDER_SITE_OTHER): Payer: PRIVATE HEALTH INSURANCE

## 2024-12-08 DIAGNOSIS — N946 Dysmenorrhea, unspecified: Secondary | ICD-10-CM

## 2024-12-08 DIAGNOSIS — Z975 Presence of (intrauterine) contraceptive device: Secondary | ICD-10-CM

## 2024-12-08 DIAGNOSIS — N920 Excessive and frequent menstruation with regular cycle: Secondary | ICD-10-CM

## 2024-12-08 DIAGNOSIS — Z86018 Personal history of other benign neoplasm: Secondary | ICD-10-CM | POA: Diagnosis not present

## 2024-12-08 NOTE — Progress Notes (Signed)
 [facility] Clinic Note    Referring provider: Melvenia Sor, NP  PCP: Sor Melvenia, NP   Chief Complaint: As below.  HPI:  This is a 39 y.o. female who presents for a routine follow up visit.  The following issues were addressed in clinic today:  --Class 1 obesity: Patient presents for routine follow up visit.   Elements of HPI: -Modifying factors/current treatment plan:   -Diet: cooks at home              -Exercise: exercises routinely (6-7X/week)              -Pharmacotherapy: none -Associated signs/symptoms: see below.    -Weight loss: none  Anthropometric data: Wt Readings from Last 5 Encounters:  12/08/24 205 lb 12.8 oz (93.4 kg)  04/02/24 204 lb 9.6 oz (92.8 kg)  03/28/23 217 lb (98.4 kg)  08/08/22 214 lb (97.1 kg)  08/08/21 222 lb (100.7 kg)    BMI Readings from Last 5 Encounters:  12/08/24 31.29 kg/m  04/02/24 31.57 kg/m  03/28/23 33.49 kg/m  08/08/22 32.54 kg/m  08/08/21 33.75 kg/m   Reviewed.  Stable.  Recent labs: Glycemic control: Lab Results  Component Value Date/Time   Glucose 68 (L) 04/02/2024 09:12 AM   Hepatic function: Lab Results  Component Value Date/Time   AST 18 04/02/2024 09:12 AM   ALT (SGPT) 20 04/02/2024 09:12 AM   Alkaline Phosphatase 42 (L) 04/02/2024 09:12 AM   Total Bilirubin 0.8 04/02/2024 09:12 AM   Lipids: No results found for: LDL, TRIG, CHOL, HDL    --Hypothyroidism (post-ablative for Graves disease) with thyroid  nodule: Patient presents for routine follow up visit.   Elements of HPI: -Modifying factors:   -Medication:   -LT4: generic levothyroxine 137 mcg daily   -LT3: none -Adherence: Yes  -Takes on an empty stomach at least 30 minutes before food/other medications: Yes  -Associated signs and symptoms: See below. -Cold intolerance: No  -Constipation: No   -Goiter: No  -Smothering sensation: No   Other factors: -Biotin use: No   -Desire for future pregnancy: No;  IUD  Relevant recent labs: TSH  Date Value Ref Range Status  06/21/2024 2.890 0.450 - 4.50 uIU/mL Final  04/02/2024 0.275 (L) 0.450 - 4.50 uIU/mL Final  12/17/2023 0.157 (L) 0.450 - 4.50 uIU/mL Final   Free T4  Date Value Ref Range Status  06/21/2024 1.11 0.82 - 1.77 ng/dL Final  94/94/7977 8.89 0.82 - 1.77 ng/dL Final  90/95/7980 8.48 0.82 - 1.77 ng/dL Final   T3, Total  Date Value Ref Range Status  03/21/2011 47 (L) 71 - 180 ng/dL Final  90/72/7988 99 71 - 180 ng/dL Final  93/82/7988 693 (H) 71 - 180 ng/dL Final   T3, Free  Date Value Ref Range Status  06/21/2024 1.9 (L) 2.0 - 4.4 pg/mL Final  04/05/2021 2.1 2.0 - 4.4 pg/mL Final  08/05/2018 2.7 2.0 - 4.4 pg/mL Final    Reviewed.  Objective Data   Vitals:   12/08/24 0815  BP: 118/76  Pulse: 78  Height: 5' 8 (1.727 m)  Weight: 205 lb 12.8 oz (93.4 kg)  SpO2: 98%  BMI (Calculated): 31.3     Physical Exam Constitutional:      Appearance: Normal appearance.  HENT:     Head: Normocephalic and atraumatic.     Nose: Nose normal.  Neck:     Thyroid : Thyromegaly present. No thyroid  mass.  Pulmonary:     Effort: Pulmonary effort is normal.  Neurological:     Mental Status: She is alert. Mental status is at baseline.   Assessment/Plan  # Class 1 obesity Assessment: Pt with Class 1 obesity based on pre-treatment BMI.   Plan: -Orders as below  Treatment regimen: -Exercise plan: as above -Dietary/nutrition plan: as above -Pharmacotherapy plan: Can consider GLP-1 in the future if needed; discussed OOP/direct options - she will consider  Previously tried/contraindicated anti-obesity meds: -N/A   # Hypothyroidism (post-ablative for Graves disease) with thyroid  nodule Assessment: Pt appears clinically euthyroid.  Goal TSH in normal range.  Plan: -Orders as below -Continue regimen as above; will adjust as needed to achieve and maintain goal TSH  -Repeat thyroid  ultrasound 03/2025; plan at next  visit  -Pregnancy precautions; alert clinic if pregnancy to optimize TSH  Orders: Orders Placed This Encounter  Procedures   TSH    Follow up in about 4 months (around 04/07/2025) for 40 min - US  Room., sooner PRN  The patient was provided information and education on her medical problems. An explanation of the plan for care, treatment, and services regarding medical condition(s) was given. The risk, benefits, side effects of medications and procedures were reviewed with the patient as well as alternatives for care. The patient acknowledged the information and the plan of care and agreed with the recommended treatment.  Importance of adherence to treatment plan for proper diagnosis/management, and cautioned about adverse effects if treatment plan is not followed.      Oneil LULLA Palmer, MD 12/08/2024, 8:26 AM

## 2024-12-09 ENCOUNTER — Ambulatory Visit: Payer: Self-pay | Admitting: Obstetrics and Gynecology

## 2025-01-11 ENCOUNTER — Ambulatory Visit: Payer: PRIVATE HEALTH INSURANCE | Admitting: Obstetrics and Gynecology

## 2025-05-03 ENCOUNTER — Ambulatory Visit: Admitting: Obstetrics and Gynecology
